# Patient Record
Sex: Female | Born: 1965 | Race: White | Hispanic: No | Marital: Married | Smoking: Never smoker
Health system: Southern US, Community
[De-identification: ages and names within clinical notes are randomized; demographics above are authoritative.]

## PROBLEM LIST (undated history)

## (undated) DIAGNOSIS — E039 Hypothyroidism, unspecified: Secondary | ICD-10-CM

## (undated) DIAGNOSIS — M549 Dorsalgia, unspecified: Secondary | ICD-10-CM

## (undated) DIAGNOSIS — G8929 Other chronic pain: Secondary | ICD-10-CM

## (undated) DIAGNOSIS — G43909 Migraine, unspecified, not intractable, without status migrainosus: Secondary | ICD-10-CM

## (undated) DIAGNOSIS — F419 Anxiety disorder, unspecified: Secondary | ICD-10-CM

## (undated) HISTORY — DX: Migraine, unspecified, not intractable, without status migrainosus: G43.909

## (undated) HISTORY — PX: ABDOMINAL SURGERY: SHX537

## (undated) HISTORY — DX: Dorsalgia, unspecified: M54.9

## (undated) HISTORY — DX: Hypothyroidism, unspecified: E03.9

## (undated) HISTORY — PX: SHOULDER SURGERY: SHX246

## (undated) HISTORY — DX: Other chronic pain: G89.29

## (undated) HISTORY — DX: Anxiety disorder, unspecified: F41.9

---

## 1998-06-11 ENCOUNTER — Ambulatory Visit (HOSPITAL_COMMUNITY): Admission: RE | Admit: 1998-06-11 | Discharge: 1998-06-11 | Payer: Self-pay | Admitting: Obstetrics and Gynecology

## 1998-06-11 ENCOUNTER — Encounter: Payer: Self-pay | Admitting: Obstetrics and Gynecology

## 1999-12-23 ENCOUNTER — Ambulatory Visit (HOSPITAL_COMMUNITY): Admission: RE | Admit: 1999-12-23 | Discharge: 1999-12-23 | Payer: Self-pay | Admitting: Obstetrics and Gynecology

## 1999-12-23 ENCOUNTER — Encounter: Payer: Self-pay | Admitting: Obstetrics and Gynecology

## 1999-12-29 ENCOUNTER — Encounter: Admission: RE | Admit: 1999-12-29 | Discharge: 2000-03-28 | Payer: Self-pay | Admitting: Obstetrics and Gynecology

## 2000-02-04 ENCOUNTER — Inpatient Hospital Stay (HOSPITAL_COMMUNITY): Admission: AD | Admit: 2000-02-04 | Discharge: 2000-02-04 | Payer: Self-pay | Admitting: Obstetrics and Gynecology

## 2000-02-19 ENCOUNTER — Encounter (HOSPITAL_COMMUNITY): Admission: AD | Admit: 2000-02-19 | Discharge: 2000-03-02 | Payer: Self-pay | Admitting: Obstetrics and Gynecology

## 2000-02-29 ENCOUNTER — Ambulatory Visit (HOSPITAL_COMMUNITY): Admission: RE | Admit: 2000-02-29 | Discharge: 2000-02-29 | Payer: Self-pay | Admitting: Obstetrics and Gynecology

## 2000-03-01 ENCOUNTER — Inpatient Hospital Stay (HOSPITAL_COMMUNITY): Admission: AD | Admit: 2000-03-01 | Discharge: 2000-03-05 | Payer: Self-pay | Admitting: *Deleted

## 2000-03-06 ENCOUNTER — Encounter: Admission: RE | Admit: 2000-03-06 | Discharge: 2000-06-04 | Payer: Self-pay | Admitting: Obstetrics and Gynecology

## 2000-03-07 ENCOUNTER — Inpatient Hospital Stay (HOSPITAL_COMMUNITY): Admission: AD | Admit: 2000-03-07 | Discharge: 2000-03-07 | Payer: Self-pay | Admitting: Obstetrics and Gynecology

## 2000-03-07 ENCOUNTER — Encounter: Payer: Self-pay | Admitting: Obstetrics and Gynecology

## 2000-11-04 ENCOUNTER — Inpatient Hospital Stay (HOSPITAL_COMMUNITY): Admission: EM | Admit: 2000-11-04 | Discharge: 2000-11-07 | Payer: Self-pay | Admitting: *Deleted

## 2000-11-09 ENCOUNTER — Other Ambulatory Visit (HOSPITAL_COMMUNITY): Admission: RE | Admit: 2000-11-09 | Discharge: 2000-11-17 | Payer: Self-pay | Admitting: Psychiatry

## 2001-05-18 ENCOUNTER — Other Ambulatory Visit: Admission: RE | Admit: 2001-05-18 | Discharge: 2001-05-18 | Payer: Self-pay | Admitting: Obstetrics and Gynecology

## 2001-10-19 ENCOUNTER — Encounter: Payer: Self-pay | Admitting: Otolaryngology

## 2001-10-19 ENCOUNTER — Encounter: Admission: RE | Admit: 2001-10-19 | Discharge: 2001-10-19 | Payer: Self-pay | Admitting: Otolaryngology

## 2001-10-20 ENCOUNTER — Encounter: Admission: RE | Admit: 2001-10-20 | Discharge: 2001-10-20 | Payer: Self-pay | Admitting: Infectious Diseases

## 2001-10-27 ENCOUNTER — Encounter: Admission: RE | Admit: 2001-10-27 | Discharge: 2001-10-27 | Payer: Self-pay | Admitting: Infectious Diseases

## 2002-06-08 ENCOUNTER — Other Ambulatory Visit: Admission: RE | Admit: 2002-06-08 | Discharge: 2002-06-08 | Payer: Self-pay | Admitting: Obstetrics and Gynecology

## 2003-01-28 ENCOUNTER — Encounter: Admission: RE | Admit: 2003-01-28 | Discharge: 2003-01-28 | Payer: Self-pay | Admitting: Otolaryngology

## 2003-03-30 HISTORY — PX: VAGINAL HYSTERECTOMY: SUR661

## 2003-09-13 ENCOUNTER — Other Ambulatory Visit: Admission: RE | Admit: 2003-09-13 | Discharge: 2003-09-13 | Payer: Self-pay | Admitting: Obstetrics and Gynecology

## 2003-11-19 ENCOUNTER — Encounter (INDEPENDENT_AMBULATORY_CARE_PROVIDER_SITE_OTHER): Payer: Self-pay | Admitting: *Deleted

## 2003-11-19 ENCOUNTER — Observation Stay (HOSPITAL_COMMUNITY): Admission: RE | Admit: 2003-11-19 | Discharge: 2003-11-20 | Payer: Self-pay | Admitting: Obstetrics and Gynecology

## 2003-12-03 ENCOUNTER — Inpatient Hospital Stay (HOSPITAL_COMMUNITY): Admission: AD | Admit: 2003-12-03 | Discharge: 2003-12-03 | Payer: Self-pay | Admitting: Obstetrics and Gynecology

## 2004-03-10 ENCOUNTER — Ambulatory Visit (HOSPITAL_BASED_OUTPATIENT_CLINIC_OR_DEPARTMENT_OTHER): Admission: RE | Admit: 2004-03-10 | Discharge: 2004-03-10 | Payer: Self-pay

## 2004-03-10 ENCOUNTER — Ambulatory Visit (HOSPITAL_COMMUNITY): Admission: RE | Admit: 2004-03-10 | Discharge: 2004-03-10 | Payer: Self-pay

## 2004-10-08 ENCOUNTER — Encounter: Admission: RE | Admit: 2004-10-08 | Discharge: 2004-10-08 | Payer: Self-pay | Admitting: Occupational Medicine
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
## 2006-06-17 ENCOUNTER — Encounter: Admission: RE | Admit: 2006-06-17 | Discharge: 2006-06-17 | Payer: Self-pay | Admitting: Neurology

## 2009-02-12 ENCOUNTER — Encounter: Admission: RE | Admit: 2009-02-12 | Discharge: 2009-02-12 | Payer: Self-pay | Admitting: Neurology

## 2010-03-15 ENCOUNTER — Emergency Department (HOSPITAL_COMMUNITY)
Admission: EM | Admit: 2010-03-15 | Discharge: 2010-03-16 | Payer: Self-pay | Source: Home / Self Care | Admitting: Emergency Medicine

## 2010-06-08 LAB — CBC
HCT: 40.9 % (ref 36.0–46.0)
Hemoglobin: 14.2 g/dL (ref 12.0–15.0)
MCV: 91.1 fL (ref 78.0–100.0)
Platelets: 299 10*3/uL (ref 150–400)
RBC: 4.49 MIL/uL (ref 3.87–5.11)
RDW: 12.4 % (ref 11.5–15.5)

## 2010-06-08 LAB — DIFFERENTIAL
Eosinophils Absolute: 0 10*3/uL (ref 0.0–0.7)
Eosinophils Relative: 0 % (ref 0–5)
Lymphs Abs: 2.2 10*3/uL (ref 0.7–4.0)

## 2010-06-08 LAB — POCT I-STAT, CHEM 8
Calcium, Ion: 1.12 mmol/L (ref 1.12–1.32)
Chloride: 104 mEq/L (ref 96–112)
HCT: 44 % (ref 36.0–46.0)
Potassium: 3.4 mEq/L — ABNORMAL LOW (ref 3.5–5.1)

## 2010-08-14 NOTE — Discharge Summary (Signed)
[facility]  Patient:    Mackenzie Huffman, Mackenzie Huffman                  MRN: [id number] Adm. Date:  [id number] Disc. Date: [id number] Attending:  Madelyn Flavors Dictator:   Danie Chandler, R.N.                           Discharge Summary  ADMITTING DIAGNOSES:          1. Intrauterine pregnancy at [redacted] weeks gestation.                               2. Gestational diabetes requiring insulin.                               3. Spontaneous onset of labor.                               4. Fetal macrosomia.                               5. Mature fetal pulmonary lung status.  DISCHARGE DIAGNOSES:          1. Intrauterine pregnancy at [redacted] weeks gestation.                               2. Gestational diabetes requiring insulin.                               3. Spontaneous onset of labor.                               4. Fetal macrosomia.                               5. Mature fetal pulmonary lung status.  PROCEDURE:                    On March 01, 2000 primary low transverse cesarean section.  REASON FOR ADMISSION:         Please see dictated H&P.  HOSPITAL COURSE:              The patient was taken to the operating room and underwent the above named procedure without complication.  This was productive of a viable female infant with Apgars of 6 at one minute and 8 at five minutes.  On postoperative day #1 the patient was without complaint.  Her vital signs were stable.  She had a good return of bowel function.  Her hemoglobin on this day was 10.3, hematocrit 29.7, and white blood cell count 16.2.  On postoperative day #2 the patient was tolerating a regular diet and ambulating well without difficulty.  Baby was in the NICU for breathing difficulties.  On postoperative day #3 the patient had better pain control. The baby was stable in NICU.  The patient was discharged home on postoperative day #4.  CONDITION ON DISCHARGE:       Good.  DIET:  Regular, as tolerated.  ACTIVITY:                     No heavy lifting, no driving, no vaginal entry.  FOLLOW-UP:                    She is to follow up in the office in one to two weeks for incision check.  She is to call for temperature greater than 100 degrees, persistent nausea or vomiting, heavy vaginal bleeding, and/or redness or drainage from the incision site.  DISCHARGE MEDICATIONS:        1. Prenatal vitamin one p.o. q.d.                               2. Motrin #30 600 mg one p.o. q.6h. p.r.n. pain.                               3. Percocet #40 one to two p.o. q.4h. p.r.n.                                  pain.                               4. Synthroid q.d. DD:  03/30/00 TD:  03/30/00 Job: [id number]

## 2010-08-14 NOTE — Discharge Summary (Signed)
[facility]  Patient:    Mackenzie Huffman, Mackenzie Huffman Visit Number: [id number] MRN: [id number]          Service Type: PSY Location: [facility] Attending Physician:  Benjaman Pott Dictated by:   Netta Cedars, M.D. Admit Date:  11/09/2000 Discharge Date: 11/17/2000                             Discharge Summary  INTRODUCTION:  The patient is a 45 year old married white female who was admitted voluntarily to [facility] after overdosing on Tylenol PM.  This woman has a history of depression but had not no formal treatment in the past.  The patient found herself in the middle of an argument between her husband and her parents.  She started taking a couple of Tylenol to calm herself down, was e-mailing her mother at the time, and expressed wishes that mother would take care of her husband and child and that she was sorry. Apparently mother got concerned receiving such an e-mail and the action that followed was that the patient was brought to the emergency room.  The patient was treated by Dr. Jeannetta Nap at [facility] for hypothyroidism with Synthroid 112 mcg daily and she was also receiving Paxil for symptoms of depression at 20 mg daily.  Initial physical examination was normal with, fortunately, acetaminophen level only 12.  The patient agreed to come to the hospital on a voluntary basis.  HOSPITAL COURSE:  After admission to the ward, the patient was placed on special observation.  She was able to contract for safety while on the unit. She was seen while on-call by Dr. Katrinka Blazing who described on August 10 as being still depressed with reserved, sad affect.  The patient was complaining of headache and tended to withdraw to her room.  On August 11, once again Dr. Katrinka Blazing noted that the patient was anxious but much less depressed.  She otherwise tolerated increase of Paxil from 20 mg to 30 mg well.  The same day, a family session took place with the  patients husband.  It seemed like the patients relationship between her and her husband and parents had been stabilized.  The patient felt that things were going to be better and the couple agreed to see a counselor on an outpatient basis.  At the time of this meeting, the patient did not express any dangerous ideations.  On August 12, I saw her for durance and at that time she was doing much better since admission.  Headache had improved.  She denied any dangerous thoughts.  She acknowledged need for psychotherapy.  The patient felt that she allowed herself to be a buffer between her and her husband and that she had to change her role otherwise things would get bad again.  I had a chance to talk to the patients husband who agreed to secure the patients medications and who felt comfortable with his wife doing better and thus could be safely discharged home.  It was felt that the patient could benefit from the outpatient program, especially group therapy, and she agreed to attend intensive outpatient treatment at our facility under the care of Dr. Ladona Ridgel.  Medical problems: As mentioned before, she had some headaches which seemed to be tension headaches and responded well when anxiety and depression got better.  The patients CBC and Chem 17 were normal.  Thyroid function tests showed increased TSH minimally to 5.84.  Urinalysis was  normal.  Vital signs throughout the hospitalization were normal with blood pressure 120/77, normal pulse, respiratory rate, and temperature.  DISCHARGE DIAGNOSES: Axis I:    Major depression, recurrent, moderate. Axis II:   Diagnosis deferred. Axis III:  1. Hypothyroidism.            2. Status post suicide attempt by overdose on Tylenol. Axis IV:   Problems with primary support group, moderate stressors. Axis V:    Global assessment of functioning upon admission 30, maximum for the            past year 75, upon discharge between 60-65.  DISCHARGE  MEDICATIONS: 1. Paxil 30 mg daily. 2. Synthroid 112 mcg daily.  DISCHARGE RECOMMENDATIONS:  The patient should call or come to the emergency room if any deterioration in her symptoms or side effects from medication.  If not sufficiently improved, she may benefit from change of antidepressant.  I also talked to the patient about the necessity to follow up with her family doctor for possible adjustment of thyroid medication.  At the time of discharge, the patient did not have any side effects from medications and in good condition, she was discharged home in the care of her husband. Dictated by:   Netta Cedars, M.D. Attending Physician:  Carolanne Grumbling D DD:  01/03/01 TD:  01/04/01 Job: [id number]

## 2010-08-14 NOTE — Op Note (Signed)
Mackenzie Huffman, Mackenzie Huffman                     ACCOUNT NO.:  [id number]   MEDICAL RECORD NO.:  [id number]                   PATIENT TYPE:  OBV   LOCATION:  9399                                 FACILITY:  [facility]   PHYSICIAN:  Guy Sandifer. Arleta Creek, M.D.           DATE OF BIRTH:  11/16/1965   DATE OF PROCEDURE:  11/19/2003  DATE OF DISCHARGE:                                 OPERATIVE REPORT   PREOPERATIVE DIAGNOSIS:  Dysmenorrhea.   POSTOPERATIVE DIAGNOSIS:  Dysmenorrhea.   OPERATION PERFORMED:  Laparoscopically assisted vaginal hysterectomy.   SURGEON:  Guy Sandifer. Henderson Cloud, M.D.   ASSISTANT:  Duke Salvia. Marcelle Overlie, M.D.   ANESTHESIA:  General with endotracheal intubation.   ESTIMATED BLOOD LOSS:  200 mL.   SPECIMENS:  Uterus.   INDICATIONS FOR PROCEDURE:  The patient is a 45 year old white female, G1,  P1 with a history of endometriosis and increasing dysmenorrhea.  Details are  dictated in the history and physical.  Laparoscopically assisted vaginal  hysterectomy and removal of one tube or ovary only if distinctly abnormal is  discussed with the patient preoperatively.  Potential risks and  complications have been discussed preoperatively including but not limited  to infection, bowel, bladder, ureteral damage, bleeding requiring  transfusion of blood products and possible transfusion reaction, HIV and  hepatitis acquisition, DVT, PE, pneumonia, laparotomy, fistula formation,  recurrent pelvic pain and postoperative dyspareunia.  All questions have  been answered and consent has been signed on the chart.   FINDINGS:  Upper abdomen is grossly normal.  Appendix was normal.  Uterus  was normal in size.  Tubes and ovaries are normal.  No active endometriosis  is noted anteriorly or posteriorly in the cul-de-sacs.   DESCRIPTION OF PROCEDURE:  The patient was taken to the operating room,  placed in dorsal supine position where general anesthesia was induced via  endotracheal intubation.   She was then placed in dorsal lithotomy position,  prepped abdominally and vaginally.  Bladder straight catheterized.  Hulka  tenaculum was placed.  The uterus was manipulated and she was draped in a  sterile fashion.  A small incision was made infraumbilically by grasping the  skin with Allis clamps and elevating the skin.  A disposable Veress needle  was then placed with a normal syringe and drop test.  Two liters of gas were  insufflated under low pressure.  There was good tympany in the right upper  quadrant with insufflation.  The Veress needle was removed.  10-11  disposable trocar sleeve was placed.  Placement was verified with a  laparoscope and no damage to surrounding structures was noted.  Pneumoperitoneum was induced.  A small suprapubic incision was made and a 5  mm disposable trocar sleeve was placed under direct visualization without  difficulty.  The above findings were noted.  Then using the Gyrus bipolar  cautery cutting instrument, the proximal ligaments were taken down  bilaterally to the level of the  vesicouterine peritoneum.  Good hemostasis  was maintained.  The vesicouterine peritoneum was incised in the midline,  hydrodissected and taken down cephalolaterally.  Instruments were removed.  Suprapubic trocar sleeve was removed and attention was turned to the vagina.  The posterior cul-de-sac was entered sharply.  The cervix was circumscribed  with the cautery.  The mucosa was advanced sharply and bluntly.  The  uterosacral ligaments were taken down bilaterally.  The ligaments were taken  down with the Gyrus bipolar instrument.  Progressive bites were then taken  of the bladder pillars, cardinal ligaments, uterine vessels bilaterally.  Fundus was delivered posteriorly, proximal ligaments were clamped and taken  down.  The pedicles were then ligated with free ties of 0 Monocryl.  All  suture will be 0 Monocryl unless otherwise designated.  The uterosacral  ligaments were  plicated to the vaginal cuff bilaterally.  They were then  plicated in the midline with a separate suture.  Cuff was closed with figure-  of-eights.  The patient has a small urethral meatus.  Therefore, a #12 Foley  catheter was placed and clear urine was noted.  Attention was returned to  the abdomen.  Copious irrigation and inspection under reduced  pneumoperitoneum multiple times revealed good hemostasis.  Excess fluid was  removed.  Suprapubic trocar sleeve was removed and the pneumoperitoneum was  completely reduced before the umbilical trocar sleeve was removed.  Subcutaneous tissues of the umbilical incision were closed with a 2-0 Vicryl  stitch.  It should be noted that the umbilical incision was injected with 5  mL of 0.5% plain Marcaine prior to the umbilical incision.  Both incisions  were then closed with surgical glue.  All counts were correct.  The patient  was awakened and taken to recovery room in stable condition.                                               Guy Sandifer Arleta Creek, M.D.    JET/MEDQ  D:  11/19/2003  T:  11/19/2003  Job:  [id number]

## 2010-08-14 NOTE — H&P (Signed)
[facility]  Patient:    Mackenzie Huffman, Mackenzie Huffman                  MRN: [id number] Adm. Date:  [id number] Attending:  Madelyn Flavors                         History and Physical  HISTORY OF PRESENT ILLNESS:   Ms. Mackenzie Huffman is a 45 year old female, gravida 1, admitted for a primary cesarean section, due to fetal macrosomia, gestational diabetes mellitus, and spontaneous onset of labor.  The patient on March 01, 2000,  underwent an amniocentesis which showed mature fetal pulmonary lung status.  The patient was originally set up for a cesarean section on Thursday, but experienced the spontaneous onset of labor today. She is now admitted for a cesarean delivery.  She at the time of the amniocentesis underwent an ultrasound which showed an estimated fetal weight of 4200 g.  The patient was counseled regarding the risks and benefits of a vaginal delivery, versus a cesarean section, and requested a cesarean delivery. She has been on insulin for gestational diabetes.  She has also been on prednisone for a PUPP rash.  PAST MEDICAL HISTORY:         1. History of depression, mild, on Paxil, stable                                  on Paxil.                               2. Hypothyroidism.  PAST SURGICAL HISTORY:        History of herniorrhaphy in 1998.  CURRENT MEDICATIONS:          1. Synthroid.                               2. Prednisone.                               3. Insulin.                               4. Paxil.                               5. Prenatal vitamins.  OBSTETRICAL LABORATORY DATA:  Maternal blood type O-positive, rubella immune, elevated glucola, consistent with gestational diabetes, group-B unknown.  ALLERGIES:                    TETRACYCLINE.  PHYSICAL EXAMINATION:  VITAL SIGNS:                  Stable, temperature 97.9 degrees, pulse 100, respirations 16, blood pressure 129/69, fetal heart tones 150s.  GENERAL:                       She is a well-developed, well-nourished female, gravid, in no acute distress.  HEENT:                        Within normal limits.  NECK:  Supple, without adenopathy or thyromegaly.  HEART:                        A regular rate and rhythm without murmur, gallop or rub.  LUNGS:                        Clear to auscultation.  BREASTS:                      Deferred.  ABDOMEN:                      Gravid and nontender.  EXTREMITIES:                  Grossly normal.  NEUROLOGIC:                   Grossly normal.  PELVIC:                       Normal external female genitalia noted.  The pelvic examination is deferred.  This was checked in the office by Dr. Guy Sandifer. Tomblin II, with unknown dilatation at this time.  ADMISSION DIAGNOSES:          1. Intrauterine pregnancy at term.                               2. Spontaneous onset of labor.                               3. Gestational diabetes, insulin-requiring.                               4. Mature fetal pulmonary lung status.                               5. Pruritic urticarial papillary plaques of                                  pregnancy rash.                               6. Depression, mild.                               7. Fetal macrosomia.  PLAN:                         Primary low transverse cesarean section.  DISCUSSION:                   The risks and benefits of surgery were explained to the patient.  The risks of bleeding, infection, risk of injury to the surrounding organs were reviewed.  The patient was allowed to ask questions, and wished to proceed. DD:  03/01/00 TD:  03/01/00 Job: [id number]

## 2010-08-14 NOTE — Op Note (Signed)
[facility]  Patient:    Mackenzie Huffman, Mackenzie Huffman                  MRN: [id number] Proc. Date: 03/01/00 Adm. Date:  [id number] Attending:  Madelyn Flavors                           Operative Report  PREOPERATIVE DIAGNOSIS:       1. Intrauterine pregnancy at 37 weeks.                               2. Gestational diabetes, insulin requiring.                               3. Spontaneous onset of labor.                               4. Fetal macrosomia.                               5. Mature fetal pulmonary lung status.  POSTOPERATIVE DIAGNOSIS:      1. Intrauterine pregnancy at 37 weeks.                               2. Gestational diabetes, insulin requiring.                               3. Spontaneous onset of labor.                               4. Fetal macrosomia.                               5. Mature fetal pulmonary lung status.  OPERATION:                    Primary low transverse cesarean section.  SURGEON:                      Willey Blade, M.D.  ANESTHESIA:                   Spinal.  ESTIMATED BLOOD LOSS:         800 cc  COMPLICATIONS:                None.  FINDINGS AT TIME OF SURGERY:  At 1650 through a low transverse uterine incision, a viable female infant was delivered from the vertex presentation. The baby was a female delivered promptly and easily at 4:15 p.m. Apgars were 6/8.  The babys weight was 8 pounds 13 ounces.  The pelvis was visualized at the time of surgery and noted to be normal. DESCRIPTION OF PROCEDURE:     The patient was taken to the operating room where a spinal anesthetic was administered.  The patient was placed on the operating table in the left lateral tilt position.  The abdomen was prepped and draped in the usual sterile fashion with Betadine and sterile  drapes.  A Foley catheter was inserted.  The abdomen was then entered through a Pfannenstiel incision and carried down sharply in the usual fashion.   The peritoneum was atraumatically entered. The vesicouterine peritoneum underlying the lower uterine segment was incised and a bladder flap was bluntly and sharply created over the lower uterine segment.  A bladder blade then placed behind the bladder.  The uterus was then entered through a low transverse incision and carried out laterally using the operators fingers.  The intraamniotic cavity was entered with abundant clear fluid noted.  The vertex was elevated into the incision and delivered promptly and easily at 1615.  The oral and nasopharynx was thoroughly bulb suctioned and the cord doubly clamped and cut, and the baby handed promptly to the pediatricians.   The baby was a female weighing 8 pounds 13 ounces.  Apgars were 6/8.  The placenta was then manually extracted intact with three-vessel cord without difficulty.  The anterior of the uterus was wiped clean thoroughly with a wet sponge.  The uterine incision was then closed in a two-layer fashion.  The first layer of running interlocking suture of #1 Vicryl suture.  A second imbricating suture was placed across the primary suture line with a running stitch of #1 Vicryl as well.  Good hemostasis was noted.  The pelvis was then thoroughly irrigated and noted to be hemostatic.  The pelvis was then visualized and noted to be normal.  Good hemostasis was once again noted.  The rectus muscle and anterior peritoneum was closed with a running stitch of #1 Vicryl.  The subfascial layers were hemostatic.  The fascia was then closed with a running stitch of 0 Panacryl.  The subfascial layers were irrigated and made hemostatic using the Bovie cautery.  The skin was reapproximated with staples and a sterile dressing applied.  Final sponge, needle, and instrument counts correct x 3.  There were no perioperative complications.  The patient did receive an antibiotic after cord clamp. DD:  03/01/00 TD:  03/01/00 Job: [id number]

## 2010-08-14 NOTE — Op Note (Signed)
Mackenzie Huffman, Mackenzie Huffman           ACCOUNT NO.:  [id number]   MEDICAL RECORD NO.:  [id number]          PATIENT TYPE:  AMB   LOCATION:  DSC                          FACILITY:  [facility]   PHYSICIAN:  Lorre Munroe., M.D.DATE OF BIRTH:  Nov 15, 1965   DATE OF PROCEDURE:  03/10/2004  DATE OF DISCHARGE:                                 OPERATIVE REPORT   PREOPERATIVE DIAGNOSES:  1.  Indirect left inguinal hernia.  2.  Sebaceous cyst of the back.   POSTOPERATIVE DIAGNOSES:  1.  Indirect left inguinal hernia.  2.  Sebaceous cyst of the back.   OPERATION PERFORMED:  1.  Repair of left inguinal hernia.  2.  Excision of sebaceous cyst of the back.   SURGEON:  Lebron Conners, M.D.   ANESTHESIA:  Local with monitored anesthesia care.   DESCRIPTION OF PROCEDURE:  After the patient was monitored and sedated and  had routine preparation and draping of the left inguinal region.  I  liberally infiltrated local anesthetic in the groin area and extended the  Pfannenstiel incision somewhat laterally using about 3 cm of the lateral  part of the incision.  I dissected down through the subcutaneous tissues  until I encountered the external oblique.  Feeling medially, I could find no  evidence of an incisional hernia.  I opened the external oblique in the  direction of its fibers into the superficial ring and noted expansion of the  round ligament indicating an indirect hernia.  The inguinal floor medial to  that was strong and there was no palpable evidence of a femoral hernia.  I  dissected the round ligament free of the pubic tubercle, cauterizing both  areas with the Bovie and I dissected the round ligament and hernia all the  way up to the deep ring and reduced the hernia.  I plugged the deep ring  with a generous patch of polypropylene mesh held in place with a 2-0 silk  suture.  I fashioned a patch of polypropylene mesh to fit the dissected area  of the inguinal floor and I sutured that on with  running 2-0 Prolene suture  from the pubic tubercle medially in the internal oblique fascia with a  basting stitch laterally with a running simple stitch in the shelving edge  of the inguinal ligament.  The repair appeared solid.  I closed the external  oblique and subcutaneous tissues with running layers of 3-0 Vicryl and  closed the skin with intracuticular 4-0 Vicryl and Steri-Strips.  We then  turned the patient on her side and I prepped the area of the palpable mass  of the left lower part of the back and then anesthetized it with local  anesthetic, made a short skin incision and dissected out a typical appearing  sebaceous cyst.  It came out intact and hemostasis was not a problem.  I  closed the skin with intracuticular 4-0 Vicryl and Steri-Strips.  The cyst  was about 1.5 cm in size.     Will  WB/MEDQ  D:  03/10/2004  T:  03/10/2004  Job:  [id number]

## 2010-08-14 NOTE — H&P (Signed)
Mackenzie Huffman, JEANCHARLES                     ACCOUNT NO.:  [id number]   MEDICAL RECORD NO.:  [id number]                   PATIENT TYPE:  OBV   LOCATION:  NA                                   FACILITY:  [facility]   PHYSICIAN:  Guy Sandifer. Arleta Creek, M.D.           DATE OF BIRTH:  02/18/1966   DATE OF ADMISSION:  11/19/2003  DATE OF DISCHARGE:                                HISTORY & PHYSICAL   CHIEF COMPLAINT:  Painful menses.   HISTORY OF PRESENT ILLNESS:  This patient is a 45 year old married white  female G1, P1, husband status post vasectomy with known endometriosis and  increasing dysmenorrhea.  This has been refractory to management with the  birth control pill.  Ultrasound and sonohistogram in March 2005 were  negative for intracavitary masses.  After discussion of the options, patient  is being admitted for laparoscopically assisted vaginal hysterectomy and  removal of a tube and ovary if abnormal.  Potential risks and complications  have been discussed preoperatively.   PAST MEDICAL HISTORY:  1. Hypothyroidism.  2. Depression.   PAST SURGICAL HISTORY:  1. Hernia repair in 1998.  2. Laparoscopy 2003.   MEDICATIONS:  Provigil, Zoloft, Synthroid.   ALLERGIES:  1. TETRACYCLINE leading to hives.  2. WELLBUTRIN leading to hives.   OBSTETRIC HISTORY:  Cesarean section x1.   FAMILY HISTORY:  Chronic hypertension in mother; asthma in mother; Crohn's  disease in brother; breast cancer maternal great aunt.   SOCIAL HISTORY:  Patient denies tobacco, alcohol, or drug abuse.   REVIEW OF SYSTEMS:  NEUROLOGY:  Denies headache.  PULMONARY:  Denies  shortness of breath.  CARDIOLOGY:  Denies chest pain.  GI:  Denies recent  changes in bowel habits.   PHYSICAL EXAMINATION:  HEIGHT:  5 feet 3-1/2 inches.  WEIGHT:  128 pounds.  BLOOD PRESSURE:  120/78.  HEENT:  Without thyromegaly.  LUNGS:  Clear to auscultation.  HEART:  Regular rate and rhythm.  BACK:  Without CVA tenderness.  BREASTS:  Without mass, retraction, discharge.  ABDOMEN:  Soft, nontender, without masses.  PELVIC:  Vulva, vagina, cervix without lesion.  Uterus normal size, mobile,  nontender.  Adnexa nontender without masses.  EXTREMITIES/NEUROLOGICAL:  Grossly within normal limits.   ASSESSMENT:  Dysmenorrhea.   PLAN:  Laparoscopically assisted vaginal hysterectomy.  Removal of one tube  or ovary if abnormal.                                               Guy Sandifer. Arleta Creek, M.D.    JET/MEDQ  D:  11/18/2003  T:  11/18/2003  Job:  [id number]

## 2010-08-14 NOTE — H&P (Signed)
[facility]  Patient:    Mackenzie Huffman, Mackenzie Huffman                  MRN: [id number] Adm. Date:  [id number] Attending:  Denny Peon Dictator:   Candi Leash. Theressa Stamps, N.P.                   Psychiatric Admission Assessment  DATE OF ADMISSION:  November 04, 2000  PATIENT IDENTIFICATION:  This is a 45 year old married white female voluntarily admitted on November 04, 2000, for overdosing on Tylenol PM.  HISTORY OF PRESENT ILLNESS:  The patient presents with a history of depression.  The patient had found herself in the middle of an argument between her husband and her parents.  The patient was feeling very overwhelmed and guilty, stating that was the worst fight they have had.  She started to take a couple Tylenol PM, was emailing her mother.  After this, the patient took some more Tylenol.  The patient was telling her mother to take care of her husband and child and that she was sorry.  Her mother got concerned.  The patient states she took the medicine "as a cry for help" and that she wanted to hopefully go to sleep and start the day fresh.  She is concerned that her parents see this as her being very "weak."  She is currently denying any suicidal or homicidal ideation, she denies any auditory or visual hallucinations or paranoia.  Her sleep has been fair.  She does have a new baby.  Her appetite has been satisfactory.  She is compliant with her medications.  PAST PSYCHIATRIC HISTORY:  She has a history of depression.  The patient has no outpatient treatment.  She uses the Internet, goes to Internet chat rooms to discuss depression.  This is her first admission, no prior suicide attempts.  SUBSTANCE ABUSE HISTORY:  She is a nonsmoker, denies any alcohol or substance abuse.  PAST MEDICAL HISTORY:  Primary care Shaelynn Dragos is Dr. Jeannetta Nap at [facility].  Medical problems are hypothyroidism.  MEDICATIONS: 1. Synthroid 112 mcg q.p.m. 2. Paxil 20 mg  q.p.m. 3. Birth control pills at h.s.  DRUG ALLERGIES:  TETRACYCLINE.  PHYSICAL EXAMINATION:  Performed at [facility] where the patient was charcoaled.  LABORATORY DATA:  Acetaminophen level was 12.  Urine drug screen was negative. Alcohol level was less than 10.  SOCIAL HISTORY:  She is a 45 year old married white female, married for 13 years.  She has a child 72 months old, lives with her spouse and child.  She is not working presently.  She has completed college.  She has no legal problems.  FAMILY HISTORY:  None that she is aware of.  MENTAL STATUS EXAMINATION:  She is an alert, young Caucasian female.  She is dressed casually.  She is cooperative with good eye contact.  Speech is normal and relevant.  Mood is depressed and sad.  Affect is sad and pleasant. Thought processes are coherent.  There is no evidence of psychosis, no auditory or visual hallucinations, no suicidal or homicidal ideations, no paranoia.  Cognitive functioning is intact.  Memory is good.  Judgment is poor.  Insight is fair.  Poor impulse control.  ADMISSION DIAGNOSES: Axis I:    Major depression, single episode. Axis II:   Deferred. Axis III:  Hypothyroidism. Axis IV:   Problems with primary support group. Axis V:    Current is 30, estimated this past year is 70 to  75.  INITIAL PLAN OF CARE:  Plan is a voluntary admission for depression and overdose on Tylenol.  Contract for safety, check every 15 minutes.  Will resume her routine medications.  Will obtain labs.  Will increase her antidepressant.  Our goal is to return the patient to her prior living arrangements, to consider therapy as the patient seems to have a lot of unresolved issues in her life, to follow up with mental health, to decrease depressive symptoms so the patient can be safe.  ESTIMATED LENGTH OF STAY:  Three to five days. DD:  11/04/00 TD:  11/04/00 Job: [id number]

## 2010-08-14 NOTE — Discharge Summary (Signed)
Mackenzie Huffman, SONS                     ACCOUNT NO.:  [id number]   MEDICAL RECORD NO.:  [id number]                   PATIENT TYPE:  OBV   LOCATION:  9309                                 [facility]ACILITY:  [facility]   PHYSICIAN:  Guy Sandifer. Arleta Creek, M.D.           DATE OF BIRTH:  June 09, 1965   DATE OF ADMISSION:  11/19/2003  DATE OF DISCHARGE:                                 DISCHARGE SUMMARY   ADMITTING DIAGNOSIS:  Dysmenorrhea.   DISCHARGE DIAGNOSIS:  Dysmenorrhea.   PROCEDURE:  On November 19, 2003, laparoscopic-assisted vaginal hysterectomy.   REASON FOR ADMISSION:  This patient is a 45 year old white female G1 P1 with  known endometriosis and increasing dysmenorrhea.  Details are dictated in  the History and Physical.  She is admitted for surgical management.   HOSPITAL COURSE:  The patient is taken to the operating room, undergoes the  above procedure.  On the evening of surgery she has good pain relief, is  tolerating a regular diet.  Vital signs are stable, she is afebrile with a  good urine output.  On the day of discharge pathology is pending.  She  remains afebrile with stable vital signs.  Passing flatus, tolerating a  regular diet, and ambulating well.  White count 11.4 and hemoglobin 9.4.   CONDITION ON DISCHARGE:  Good.   DIET:  Regular as tolerated.   ACTIVITY:  No lifting, no operation of automobiles, no vaginal entry.  She  is to call the office for problems including but not limited to temperature  of 101 degrees, persistent nausea/vomiting, increasing pain, or heavy  vaginal bleeding.   MEDICATIONS:  1. Ibuprofen 600 mg q.6h. p.r.n.  2. Percocet 5/325 mg #30 one to two p.o. q.6h. p.r.n.  3. Multivitamin daily.  4. Colace one to two per day as needed.   FOLLOW-UP:  In the office in 2 weeks.                                               Guy Sandifer Arleta Creek, M.D.    JET/MEDQ  D:  11/20/2003  T:  11/20/2003  Job:  [id number]

## 2012-03-29 HISTORY — PX: OTHER SURGICAL HISTORY: SHX169

## 2014-05-27 ENCOUNTER — Encounter: Payer: Self-pay | Admitting: *Deleted

## 2014-07-31 ENCOUNTER — Other Ambulatory Visit: Payer: Self-pay

## 2014-07-31 DIAGNOSIS — R1909 Other intra-abdominal and pelvic swelling, mass and lump: Secondary | ICD-10-CM

## 2014-07-31 NOTE — Addendum Note (Signed)
Addended by: Adolph PollackOSENBOWER, Elyza Whitt J on: 07/31/2014 11:05 AM   Modules accepted: Orders

## 2014-08-02 ENCOUNTER — Other Ambulatory Visit: Payer: Self-pay | Admitting: General Surgery

## 2014-08-02 DIAGNOSIS — R1909 Other intra-abdominal and pelvic swelling, mass and lump: Secondary | ICD-10-CM

## 2014-08-05 ENCOUNTER — Other Ambulatory Visit: Payer: Self-pay

## 2014-08-08 ENCOUNTER — Other Ambulatory Visit: Payer: Self-pay | Admitting: Radiology

## 2014-08-12 ENCOUNTER — Ambulatory Visit
Admission: RE | Admit: 2014-08-12 | Discharge: 2014-08-12 | Disposition: A | Discharge status: Home / Self Care | Payer: Medicaid Other | Source: Ambulatory Visit | Attending: General Surgery | Admitting: General Surgery

## 2014-08-12 DIAGNOSIS — R1909 Other intra-abdominal and pelvic swelling, mass and lump: Secondary | ICD-10-CM

## 2014-08-12 MED ORDER — IOPAMIDOL (ISOVUE-300) INJECTION 61%
100.0000 mL | Freq: Once | INTRAVENOUS | Status: AC | PRN
  Administered 2014-08-12: 100 mL via INTRAVENOUS

## 2014-08-20 ENCOUNTER — Encounter: Payer: Self-pay | Admitting: General Surgery

## 2014-09-27 ENCOUNTER — Encounter (HOSPITAL_BASED_OUTPATIENT_CLINIC_OR_DEPARTMENT_OTHER): Payer: Self-pay | Admitting: *Deleted

## 2014-10-03 ENCOUNTER — Ambulatory Visit (HOSPITAL_BASED_OUTPATIENT_CLINIC_OR_DEPARTMENT_OTHER): Payer: Medicaid Other | Admitting: Anesthesiology

## 2014-10-03 ENCOUNTER — Ambulatory Visit (HOSPITAL_BASED_OUTPATIENT_CLINIC_OR_DEPARTMENT_OTHER)
Admission: RE | Admit: 2014-10-03 | Discharge: 2014-10-03 | Disposition: A | Discharge status: Home / Self Care | Payer: Medicaid Other | Source: Ambulatory Visit | Attending: General Surgery | Admitting: General Surgery

## 2014-10-03 ENCOUNTER — Encounter (HOSPITAL_BASED_OUTPATIENT_CLINIC_OR_DEPARTMENT_OTHER): Payer: Self-pay

## 2014-10-03 ENCOUNTER — Encounter (HOSPITAL_BASED_OUTPATIENT_CLINIC_OR_DEPARTMENT_OTHER)
Admission: RE | Disposition: A | Discharge status: Home / Self Care | Payer: Self-pay | Source: Ambulatory Visit | Attending: General Surgery

## 2014-10-03 DIAGNOSIS — Z880 Allergy status to penicillin: Secondary | ICD-10-CM | POA: Diagnosis not present

## 2014-10-03 DIAGNOSIS — R1909 Other intra-abdominal and pelvic swelling, mass and lump: Secondary | ICD-10-CM | POA: Insufficient documentation

## 2014-10-03 DIAGNOSIS — Z79899 Other long term (current) drug therapy: Secondary | ICD-10-CM | POA: Insufficient documentation

## 2014-10-03 DIAGNOSIS — Z90710 Acquired absence of both cervix and uterus: Secondary | ICD-10-CM | POA: Diagnosis not present

## 2014-10-03 DIAGNOSIS — F419 Anxiety disorder, unspecified: Secondary | ICD-10-CM | POA: Insufficient documentation

## 2014-10-03 DIAGNOSIS — M549 Dorsalgia, unspecified: Secondary | ICD-10-CM | POA: Diagnosis not present

## 2014-10-03 DIAGNOSIS — G43909 Migraine, unspecified, not intractable, without status migrainosus: Secondary | ICD-10-CM | POA: Diagnosis not present

## 2014-10-03 DIAGNOSIS — Z8249 Family history of ischemic heart disease and other diseases of the circulatory system: Secondary | ICD-10-CM | POA: Insufficient documentation

## 2014-10-03 DIAGNOSIS — R590 Localized enlarged lymph nodes: Secondary | ICD-10-CM | POA: Diagnosis not present

## 2014-10-03 DIAGNOSIS — Z888 Allergy status to other drugs, medicaments and biological substances status: Secondary | ICD-10-CM | POA: Diagnosis not present

## 2014-10-03 DIAGNOSIS — Z8349 Family history of other endocrine, nutritional and metabolic diseases: Secondary | ICD-10-CM | POA: Insufficient documentation

## 2014-10-03 HISTORY — PX: GROIN DISSECTION: SHX5250

## 2014-10-03 LAB — POCT I-STAT, CHEM 8
BUN: 13 mg/dL (ref 6–20)
CREATININE: 0.5 mg/dL (ref 0.44–1.00)
Calcium, Ion: 1.1 mmol/L — ABNORMAL LOW (ref 1.12–1.23)
Chloride: 108 mmol/L (ref 101–111)
GLUCOSE: 93 mg/dL (ref 65–99)
HCT: 43 % (ref 36.0–46.0)
Hemoglobin: 14.6 g/dL (ref 12.0–15.0)
POTASSIUM: 3.8 mmol/L (ref 3.5–5.1)
Sodium: 138 mmol/L (ref 135–145)
TCO2: 19 mmol/L (ref 0–100)

## 2014-10-03 SURGERY — EXPLORATION, INGUINAL REGION
Anesthesia: General | Site: Groin | Laterality: Right

## 2014-10-03 MED ORDER — MIDAZOLAM HCL 2 MG/2ML IJ SOLN
1.0000 mg | INTRAMUSCULAR | Status: DC | PRN
  Administered 2014-10-03: 2 mg via INTRAVENOUS

## 2014-10-03 MED ORDER — ONDANSETRON HCL 4 MG/2ML IJ SOLN
INTRAMUSCULAR | Status: DC | PRN
  Administered 2014-10-03: 4 mg via INTRAVENOUS

## 2014-10-03 MED ORDER — LIDOCAINE HCL (CARDIAC) 20 MG/ML IV SOLN
INTRAVENOUS | Status: DC | PRN
  Administered 2014-10-03: 75 mg via INTRAVENOUS

## 2014-10-03 MED ORDER — GLYCOPYRROLATE 0.2 MG/ML IJ SOLN: 0.2000 mg | Freq: Once | INTRAMUSCULAR | Status: DC | PRN

## 2014-10-03 MED ORDER — PROPOFOL 500 MG/50ML IV EMUL
INTRAVENOUS | Status: AC
  Filled 2014-10-03: qty 100

## 2014-10-03 MED ORDER — SUFENTANIL CITRATE 50 MCG/ML IV SOLN
INTRAVENOUS | Status: DC | PRN
  Administered 2014-10-03: 10 ug via INTRAVENOUS

## 2014-10-03 MED ORDER — LACTATED RINGERS IV SOLN
INTRAVENOUS | Status: DC
  Administered 2014-10-03: 10:00:00 via INTRAVENOUS

## 2014-10-03 MED ORDER — OXYCODONE HCL 5 MG PO TABS
5.0000 mg | ORAL_TABLET | Freq: Once | ORAL | Status: AC | PRN
  Administered 2014-10-03: 5 mg via ORAL

## 2014-10-03 MED ORDER — MEPERIDINE HCL 25 MG/ML IJ SOLN: 6.2500 mg | INTRAMUSCULAR | Status: DC | PRN

## 2014-10-03 MED ORDER — OXYCODONE HCL 5 MG PO TABS: 5.0000 mg | ORAL_TABLET | ORAL | Status: DC | PRN

## 2014-10-03 MED ORDER — VANCOMYCIN HCL IN DEXTROSE 1-5 GM/200ML-% IV SOLN
INTRAVENOUS | Status: AC
  Filled 2014-10-03: qty 200

## 2014-10-03 MED ORDER — HYDROMORPHONE HCL 1 MG/ML IJ SOLN
0.2500 mg | INTRAMUSCULAR | Status: DC | PRN
  Administered 2014-10-03: 0.25 mg via INTRAVENOUS

## 2014-10-03 MED ORDER — SCOPOLAMINE 1 MG/3DAYS TD PT72: 1.0000 | MEDICATED_PATCH | Freq: Once | TRANSDERMAL | Status: DC | PRN

## 2014-10-03 MED ORDER — DEXAMETHASONE SODIUM PHOSPHATE 4 MG/ML IJ SOLN
INTRAMUSCULAR | Status: DC | PRN
  Administered 2014-10-03: 10 mg via INTRAVENOUS

## 2014-10-03 MED ORDER — LIDOCAINE-EPINEPHRINE (PF) 1 %-1:200000 IJ SOLN
INTRAMUSCULAR | Status: AC
  Filled 2014-10-03: qty 10

## 2014-10-03 MED ORDER — SUFENTANIL CITRATE 50 MCG/ML IV SOLN
INTRAVENOUS | Status: AC
  Filled 2014-10-03: qty 1

## 2014-10-03 MED ORDER — SUCCINYLCHOLINE CHLORIDE 20 MG/ML IJ SOLN
INTRAMUSCULAR | Status: AC
  Filled 2014-10-03: qty 1

## 2014-10-03 MED ORDER — FENTANYL CITRATE (PF) 100 MCG/2ML IJ SOLN: 50.0000 ug | INTRAMUSCULAR | Status: DC | PRN

## 2014-10-03 MED ORDER — VANCOMYCIN HCL IN DEXTROSE 1-5 GM/200ML-% IV SOLN
1000.0000 mg | INTRAVENOUS | Status: AC
  Administered 2014-10-03: 1000 mg via INTRAVENOUS

## 2014-10-03 MED ORDER — OXYCODONE HCL 5 MG/5ML PO SOLN: 5.0000 mg | Freq: Once | ORAL | Status: AC | PRN

## 2014-10-03 MED ORDER — MIDAZOLAM HCL 2 MG/2ML IJ SOLN
INTRAMUSCULAR | Status: AC
  Filled 2014-10-03: qty 2

## 2014-10-03 MED ORDER — BUPIVACAINE HCL (PF) 0.5 % IJ SOLN
INTRAMUSCULAR | Status: DC | PRN
  Administered 2014-10-03: 9 mL

## 2014-10-03 MED ORDER — OXYCODONE HCL 5 MG PO TABS
ORAL_TABLET | ORAL | Status: AC
  Filled 2014-10-03: qty 1

## 2014-10-03 MED ORDER — PROPOFOL 10 MG/ML IV BOLUS
INTRAVENOUS | Status: DC | PRN
  Administered 2014-10-03: 200 mg via INTRAVENOUS

## 2014-10-03 MED ORDER — HYDROMORPHONE HCL 1 MG/ML IJ SOLN
INTRAMUSCULAR | Status: AC
  Filled 2014-10-03: qty 1

## 2014-10-03 MED ORDER — BUPIVACAINE HCL (PF) 0.5 % IJ SOLN
INTRAMUSCULAR | Status: AC
  Filled 2014-10-03: qty 30

## 2014-10-03 SURGICAL SUPPLY — 56 items
APL SKNCLS STERI-STRIP NONHPOA (GAUZE/BANDAGES/DRESSINGS) ×1
BENZOIN TINCTURE PRP APPL 2/3 (GAUZE/BANDAGES/DRESSINGS) ×3 IMPLANT
BLADE CLIPPER SURG (BLADE) IMPLANT
BLADE SURG 10 STRL SS (BLADE) ×1 IMPLANT
BLADE SURG 15 STRL LF DISP TIS (BLADE) ×1 IMPLANT
BLADE SURG 15 STRL SS (BLADE) ×3
CANISTER SUCT 1200ML W/VALVE (MISCELLANEOUS) IMPLANT
CHLORAPREP W/TINT 26ML (MISCELLANEOUS) ×3 IMPLANT
CLEANER CAUTERY TIP 5X5 PAD (MISCELLANEOUS) ×1 IMPLANT
CLOSURE WOUND 1/2 X4 (GAUZE/BANDAGES/DRESSINGS) ×1
COVER BACK TABLE 60X90IN (DRAPES) ×3 IMPLANT
COVER MAYO STAND STRL (DRAPES) ×3 IMPLANT
DECANTER SPIKE VIAL GLASS SM (MISCELLANEOUS) ×1 IMPLANT
DRAIN PENROSE 1/2X12 LTX STRL (WOUND CARE) ×1 IMPLANT
DRAPE INCISE IOBAN 66X45 STRL (DRAPES) ×1 IMPLANT
DRAPE LAPAROTOMY T 102X78X121 (DRAPES) ×3 IMPLANT
DRAPE UTILITY XL STRL (DRAPES) ×5 IMPLANT
DRSG TEGADERM 2-3/8X2-3/4 SM (GAUZE/BANDAGES/DRESSINGS) IMPLANT
DRSG TEGADERM 4X4.75 (GAUZE/BANDAGES/DRESSINGS) ×3 IMPLANT
ELECT REM PT RETURN 9FT ADLT (ELECTROSURGICAL) ×3
ELECTRODE REM PT RTRN 9FT ADLT (ELECTROSURGICAL) ×1 IMPLANT
GAUZE SPONGE 4X4 16PLY XRAY LF (GAUZE/BANDAGES/DRESSINGS) ×2 IMPLANT
GLOVE BIO SURGEON STRL SZ 6.5 (GLOVE) ×1 IMPLANT
GLOVE BIO SURGEONS STRL SZ 6.5 (GLOVE) ×1
GLOVE BIOGEL PI IND STRL 7.0 (GLOVE) IMPLANT
GLOVE BIOGEL PI IND STRL 8.5 (GLOVE) ×1 IMPLANT
GLOVE BIOGEL PI INDICATOR 7.0 (GLOVE) ×2
GLOVE BIOGEL PI INDICATOR 8.5 (GLOVE) ×2
GLOVE ECLIPSE 8.0 STRL XLNG CF (GLOVE) ×3 IMPLANT
GLOVE EXAM NITRILE EXT CUFF MD (GLOVE) ×2 IMPLANT
GOWN STRL REUS W/ TWL LRG LVL3 (GOWN DISPOSABLE) ×1 IMPLANT
GOWN STRL REUS W/TWL LRG LVL3 (GOWN DISPOSABLE) ×9
NDL HYPO 25X1 1.5 SAFETY (NEEDLE) ×1 IMPLANT
NEEDLE HYPO 25X1 1.5 SAFETY (NEEDLE) ×3 IMPLANT
NS IRRIG 1000ML POUR BTL (IV SOLUTION) IMPLANT
PACK BASIN DAY SURGERY FS (CUSTOM PROCEDURE TRAY) ×3 IMPLANT
PAD CLEANER CAUTERY TIP 5X5 (MISCELLANEOUS) ×2
PENCIL BUTTON HOLSTER BLD 10FT (ELECTRODE) ×3 IMPLANT
SLEEVE SCD COMPRESS KNEE MED (MISCELLANEOUS) ×2 IMPLANT
SPONGE INTESTINAL PEANUT (DISPOSABLE) IMPLANT
STRIP CLOSURE SKIN 1/2X4 (GAUZE/BANDAGES/DRESSINGS) ×2 IMPLANT
SUT MNCRL AB 3-0 PS2 18 (SUTURE) ×2 IMPLANT
SUT MON AB 4-0 PC3 18 (SUTURE) ×1 IMPLANT
SUT PROLENE 2 0 CT2 30 (SUTURE) ×2 IMPLANT
SUT VIC AB 2-0 SH 18 (SUTURE) ×1 IMPLANT
SUT VIC AB 3-0 SH 27 (SUTURE) ×3
SUT VIC AB 3-0 SH 27X BRD (SUTURE) ×1 IMPLANT
SUT VICRYL 3-0 CR8 SH (SUTURE) ×2 IMPLANT
SUT VICRYL AB 2 0 TIE (SUTURE) IMPLANT
SUT VICRYL AB 2 0 TIES (SUTURE)
SYR CONTROL 10ML LL (SYRINGE) ×3 IMPLANT
TOWEL OR 17X24 6PK STRL BLUE (TOWEL DISPOSABLE) ×4 IMPLANT
TOWEL OR NON WOVEN STRL DISP B (DISPOSABLE) ×1 IMPLANT
TUBE CONNECTING 20'X1/4 (TUBING)
TUBE CONNECTING 20X1/4 (TUBING) IMPLANT
YANKAUER SUCT BULB TIP NO VENT (SUCTIONS) IMPLANT

## 2014-10-03 NOTE — Anesthesia Procedure Notes (Signed)
Procedure Name: LMA Insertion Date/Time: 10/03/2014 10:11 AM Performed by: Zenia ResidesPAYNE, Christophor Eick D Pre-anesthesia Checklist: Patient identified, Emergency Drugs available, Suction available and Patient being monitored Patient Re-evaluated:Patient Re-evaluated prior to inductionOxygen Delivery Method: Circle System Utilized Preoxygenation: Pre-oxygenation with 100% oxygen Intubation Type: IV induction Ventilation: Mask ventilation without difficulty LMA: LMA inserted LMA Size: 4.0 Number of attempts: 1 Airway Equipment and Method: Bite block Placement Confirmation: positive ETCO2 Tube secured with: Tape Dental Injury: Teeth and Oropharynx as per pre-operative assessment

## 2014-10-03 NOTE — Anesthesia Preprocedure Evaluation (Signed)

## 2014-10-03 NOTE — Interval H&P Note (Signed)
History and Physical Interval Note:  10/03/2014 10:01 AM  Mackenzie Huffman  has presented today for surgery, with the diagnosis of RIGHT GROIN MASS  The various methods of treatment have been discussed with the patient and family. After consideration of risks, benefits and other options for treatment, the patient has consented to  Procedure(s): GROIN EXPLORATION AND REMOVAL OF GROIN MASS (Right) as a surgical intervention .  The patient's history has been reviewed, patient examined, no change in status, stable for surgery.  I have reviewed the patient's chart and labs.  Questions were answered to the patient's satisfaction.     [name] JShela Commons

## 2014-10-03 NOTE — H&P (Signed)
Mackenzie Huffman  Location: [facility] Patient #: [id number] DOB: 1965/09/03 Married / Language: English / Race: White Female  History of Present Illness  Patient words: Reevaluate right groin pain and mass.  The patient is a 49 year old female   Note:She is here for follow-up visit after her pelvic CT scan. This did not show any evidence of a right groin hernia. In fact, no soft tissue mass was seen. She reports she still has the mass in the right groin area which is significantly painful at times. As for the 3.4 cm right adnexal lesion, she is going to get a referral to a gynecologist.   Allergies  No Known Drug Allergies05/06/2014  Prior to Admission medications   Medication Sig Start Date End Date Taking? Authorizing Provider  Ascorbic Acid (VITAMIN C) 1000 MG tablet Take 1,000 mg by mouth daily.   Yes Historical Provider, MD  atenolol (TENORMIN) 100 MG tablet Take 100 mg by mouth daily.   Yes Historical Provider, MD  cyclobenzaprine (FLEXERIL) 10 MG tablet Take 10 mg by mouth 3 (three) times daily as needed for muscle spasms.   Yes Historical Provider, MD  levothyroxine (SYNTHROID, LEVOTHROID) 75 MCG tablet Take 75 mcg by mouth daily before breakfast.   Yes Historical Provider, MD  oxyCODONE-acetaminophen (PERCOCET) 10-325 MG per tablet Take 1 tablet by mouth every 4 (four) hours as needed for pain.   Yes Historical Provider, MD  SUMAtriptan (IMITREX) 50 MG tablet Take 50 mg by mouth once. May repeat in 2 hours if headache persists or recurs.   Yes Historical Provider, MD      Physical Exam The physical exam findings are as follows: :Right groin-there is a 2-2.5 cm palpable soft tissue mass that is tender to palpation. It does not move with the Valsalva maneuver.    Assessment & Plan  RIGHT GROIN MASS (789.39  R19.09) Impression: Not seen on CT scan but persists on physical exam. Remains symptomatic.  Plan: Right groin exploration and removal of mass. If  this is a hernia we will repair that with mesh at the time. She and I have discussed this at length. I did tell her that there was no guarantee this will get rid of all of her pain. The procedure and risks were discussed with her. The risks include but are not limited to bleeding, infection, wound healing problem, anesthesia, nerve injury, need for further surgery, recurrence if we had do hernia repair with mesh. She seems to understand this and would like to proceed.  Avel Peaceodd Ellenie Salome, M.D.

## 2014-10-03 NOTE — Discharge Instructions (Addendum)
[facility] _______[facility]  GROIN SURGERY: POST OP INSTRUCTIONS  Always review your discharge instruction sheet given to you by the facility where your surgery was performed. IF YOU HAVE DISABILITY OR FAMILY LEAVE FORMS, YOU MUST BRING THEM TO THE OFFICE FOR PROCESSING.   DO NOT GIVE THEM TO YOUR DOCTOR.  1. A  prescription for pain medication may be given to you upon discharge.  Take your pain medication as prescribed, if needed.  If narcotic pain medicine is not needed, then you may take acetaminophen (Tylenol) or ibuprofen (Advil) as needed. 2. Take your usually prescribed medications unless otherwise directed. 3. If you need a refill on your pain medication, please contact your pharmacy.  They will contact our office to request authorization. Prescriptions will not be filled after 5 pm or on week-ends. 4. You should follow a light diet the first 24 hours after arrival home, such as soup and crackers, etc.  Be sure to include lots of fluids daily.  Resume your normal diet the day after surgery. 5. Most patients will experience some swelling and bruising in the groin.  Ice packs and reclining will help.  Swelling and bruising can take several days to resolve.  6. It is common to experience some constipation if taking pain medication after surgery.  Increasing fluid intake and taking a stool softener (such as Colace) will usually help or prevent this problem from occurring.  A mild laxative (Milk of Magnesia or Miralax) should be taken according to package directions if there are no bowel movements after 48 hours. 7. Unless discharge instructions indicate otherwise, you may remove your bandages 72 hours after surgery.   You may shower the day after surgery..  You may have steri-strips (small skin tapes) in place directly over the incision.  These strips should be left on the skin for 15 days.  If your surgeon used skin glue on the incision, you may shower in 24 hours.  The glue will flake  off over the next 2-3 weeks.  Any sutures or staples will be removed at the office during your follow-up visit. 8. ACTIVITIES:  You may resume regular (light) daily activities beginning the next day--such as daily self-care, walking, climbing stairs--gradually increasing activities as tolerated.  You may have sexual intercourse when it is comfortable.  Refrain from any heavy lifting or straining for 1-2 weeks until your are pain-free. a. You may drive when you are no longer taking prescription pain medication, you can comfortably wear a seatbelt, and you can safely maneuver your car and apply brakes. b. RETURN TO WORK:  _ Desk work/light work in 1-2 weeks, full duty when pain-free._________________________________________________________ 9. You should see your doctor in the office for a follow-up appointment approximately 2-3 weeks after your surgery.  Make sure that you call for this appointment within a day or two after you arrive home to insure a convenient appointment time. 10. OTHER INSTRUCTIONS:  __________________________________________________________________________________________________________________________________________________________________________________________  WHEN TO CALL YOUR DOCTOR: 1. Fever over 101.0 2. Inability to urinate 3. Nausea and/or vomiting 4. Extreme swelling or bruising 5. Continued bleeding from incision. 6. Increased pain, redness, or drainage from the incision  The clinic staff is available to answer your questions during regular business hours.  Please dont hesitate to call and ask to speak to one of the nurses for clinical concerns.  If you have a medical emergency, go to the nearest emergency room or call 911.  A surgeon from [facility] is always on call  at the hospital   [address], [address], [place], [place]  [address] ?  [address] [address], [place], [place]   [id number] [phone] ? [phone] ? FAX [phone] Web site:  [email or web address]  Post Anesthesia Home Care Instructions  Activity: Get plenty of rest for the remainder of the day. A responsible adult should stay with you for 24 hours following the procedure.  For the next 24 hours, DO NOT: -Drive a car -Advertising copywriterperate machinery -Drink alcoholic beverages -Take any medication unless instructed by your physician -Make any legal decisions or sign important papers.  Meals: Start with liquid foods such as gelatin or soup. Progress to regular foods as tolerated. Avoid greasy, spicy, heavy foods. If nausea and/or vomiting occur, drink only clear liquids until the nausea and/or vomiting subsides. Call your physician if vomiting continues.  Special Instructions/Symptoms: Your throat may feel dry or sore from the anesthesia or the breathing tube placed in your throat during surgery. If this causes discomfort, gargle with warm salt water. The discomfort should disappear within 24 hours.  If you had a scopolamine patch placed behind your ear for the management of post- operative nausea and/or vomiting:  1. The medication in the patch is effective for 72 hours, after which it should be removed.  Wrap patch in a tissue and discard in the trash. Wash hands thoroughly with soap and water. 2. You may remove the patch earlier than 72 hours if you experience unpleasant side effects which may include dry mouth, dizziness or visual disturbances. 3. Avoid touching the patch. Wash your hands with soap and water after contact with the patch.

## 2014-10-03 NOTE — Op Note (Addendum)
Operative Note  Mackenzie Huffman female 49 y.o. 10/03/2014  PREOPERATIVE DX:  Painful soft tissue mass right groin  POSTOPERATIVE DX:  Same  PROCEDURE:   Right groin exploration and removal of 4 cm soft tissue mass         Surgeon: Adolph PollackOSENBOWER,Alexyss Balzarini J   Assistants: none  Anesthesia: General LMA anesthesia  Indications:   This is a 49 year old female has developed a painful right groin soft tissue mass just lateral and inferior to her cesarean section scar.  The mass is evident on clinical exam but not on pelvic CT scan. She now presents for right groin exploration.  We discussed the fact that this may not resolve her pain.    Procedure Detail:  She was seen in the holding area. The mass was palpated and was tender. My initials were placed in the right groin. She was brought to the operating room placed supine on the operating table and a general anesthetic was given. Right groin area were sterilely prepped and draped.  Half percent Marcaine was infiltrated in the subdermal area. A right groin incision was made, below the groin crease, through the skin and subcutaneous tissues. The mass was palpable was very firm. Using electrocautery I began dissecting the mass free from the underlying fascial tissues. There was a lymph node identified that was part of the mass. The mass appeared mostly lipomatous in nature other than the lymph node that was identified. The mass was subsequently removed and then sent to pathology. Bleeding was controlled with electrocautery. Some small bleeding vessels were ligated as well.  Marcaine was then injected into the depth of the wound for local anesthetic effect.  The wound was inspected and hemostasis was adequate. Subcutaneous tissues and closed with interrupted 3-0 Vicryl sutures. The skin was closed with a running 3-0 Monocryl subcuticular stitch. Steri-Strips and a sterile dressing were applied.  She tolerated the procedure well without any apparent  complications and was taken to recovery room in satisfactory condition.  Estimated Blood Loss:  less than 100 mL                Specimens: 4 cm right groin soft tissue mass        Complications:  * No complications entered in OR log *         Disposition: PACU - hemodynamically stable.         Condition: stable

## 2014-10-03 NOTE — Anesthesia Postprocedure Evaluation (Signed)
  Anesthesia Post-op Note  Patient: Mackenzie Huffman  Procedure(s) Performed: Procedure(s): GROIN EXPLORATION AND REMOVAL OF GROIN MASS (Right)  Patient Location: PACU  Anesthesia Type: General   Level of Consciousness: awake, alert  and oriented  Airway and Oxygen Therapy: Patient Spontanous Breathing  Post-op Pain: mild  Post-op Assessment: Post-op Vital signs reviewed  Post-op Vital Signs: Reviewed  Last Vitals:  Filed Vitals:   10/03/14 1200  BP: 115/71  Pulse: 72  Temp: 37 C  Resp: 14    Complications: No apparent anesthesia complications

## 2014-10-03 NOTE — Transfer of Care (Signed)
Immediate Anesthesia Transfer of Care Note  Patient: Mackenzie Huffman  Procedure(s) Performed: Procedure(s): GROIN EXPLORATION AND REMOVAL OF GROIN MASS (Right)  Patient Location: PACU  Anesthesia Type:General  Level of Consciousness: sedated  Airway & Oxygen Therapy: Patient Spontanous Breathing and Patient connected to face mask oxygen  Post-op Assessment: Report given to RN and Post -op Vital signs reviewed and stable  Post vital signs: Reviewed and stable  Last Vitals:  Filed Vitals:   10/03/14 0928  BP: 127/63  Pulse: 63  Temp: 36.9 C  Resp: 20    Complications: No apparent anesthesia complications

## 2014-10-04 ENCOUNTER — Encounter (HOSPITAL_BASED_OUTPATIENT_CLINIC_OR_DEPARTMENT_OTHER): Payer: Self-pay | Admitting: General Surgery

## 2014-12-16 ENCOUNTER — Ambulatory Visit (INDEPENDENT_AMBULATORY_CARE_PROVIDER_SITE_OTHER): Payer: Medicaid Other | Admitting: Diagnostic Neuroimaging

## 2014-12-16 ENCOUNTER — Encounter: Payer: Self-pay | Admitting: Diagnostic Neuroimaging

## 2014-12-16 VITALS — BP 126/73 | HR 66 | Ht 64.0 in | Wt 146.8 lb

## 2014-12-16 DIAGNOSIS — G4440 Drug-induced headache, not elsewhere classified, not intractable: Secondary | ICD-10-CM | POA: Insufficient documentation

## 2014-12-16 DIAGNOSIS — G4486 Cervicogenic headache: Secondary | ICD-10-CM

## 2014-12-16 DIAGNOSIS — G43009 Migraine without aura, not intractable, without status migrainosus: Secondary | ICD-10-CM | POA: Insufficient documentation

## 2014-12-16 DIAGNOSIS — R51 Headache: Secondary | ICD-10-CM

## 2014-12-16 DIAGNOSIS — T3995XA Adverse effect of unspecified nonopioid analgesic, antipyretic and antirheumatic, initial encounter: Secondary | ICD-10-CM

## 2014-12-16 MED ORDER — ATENOLOL 100 MG PO TABS: 100.0000 mg | ORAL_TABLET | Freq: Every day | ORAL | Status: DC

## 2014-12-16 MED ORDER — TOPIRAMATE 50 MG PO TABS: 50.0000 mg | ORAL_TABLET | Freq: Two times a day (BID) | ORAL | Status: DC

## 2014-12-16 NOTE — Patient Instructions (Signed)
Increase atenolol to  at bedtime.  Start topiramate  at bedtime; after 2 weks increase to twice a day.  Continue sumatriptan as needed for migraines.  Consider psychiatry/psychology evaluation for depression/anxiety.
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
## 2015-03-18 ENCOUNTER — Ambulatory Visit (INDEPENDENT_AMBULATORY_CARE_PROVIDER_SITE_OTHER): Payer: Medicaid Other | Admitting: Diagnostic Neuroimaging

## 2015-03-18 ENCOUNTER — Encounter: Payer: Self-pay | Admitting: Diagnostic Neuroimaging

## 2015-03-18 VITALS — BP 118/67 | HR 54 | Ht 64.0 in | Wt 147.0 lb

## 2015-03-18 DIAGNOSIS — R51 Headache: Secondary | ICD-10-CM

## 2015-03-18 DIAGNOSIS — G4440 Drug-induced headache, not elsewhere classified, not intractable: Secondary | ICD-10-CM

## 2015-03-18 DIAGNOSIS — T3995XA Adverse effect of unspecified nonopioid analgesic, antipyretic and antirheumatic, initial encounter: Secondary | ICD-10-CM

## 2015-03-18 DIAGNOSIS — G4486 Cervicogenic headache: Secondary | ICD-10-CM

## 2015-03-18 DIAGNOSIS — G43009 Migraine without aura, not intractable, without status migrainosus: Secondary | ICD-10-CM

## 2015-03-18 MED ORDER — VERAPAMIL HCL 40 MG PO TABS: 40.0000 mg | ORAL_TABLET | Freq: Two times a day (BID) | ORAL | Status: DC

## 2015-03-18 MED ORDER — ATENOLOL 100 MG PO TABS: 50.0000 mg | ORAL_TABLET | Freq: Every day | ORAL | Status: DC
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
## 2015-03-18 NOTE — Patient Instructions (Signed)
Thank you for coming to see [place] at [facility]. I hope we have been able to provide you high quality care today.  You may receive a patient satisfaction survey over the next few weeks. We would appreciate your feedback and comments so that we may continue to improve ourselves and the health of our patients.  - start verapamil '40mg'$  twice a day - reduce atenolol to '50mg'$  at bedtime - monitor blood pressure and heart rate; monitor for dizziness and lightheadedness   ~~~~~~~~~~~~~~~~~~~~~~~~~~~~~~~~~~~~~~~~~~~~~~~~~~~~~~~~~~~~~~~~~  DR. PENUMALLI'S GUIDE TO HAPPY AND HEALTHY LIVING These are some of my general health and wellness recommendations. Some of them may apply to you better than others. Please use common sense as you try these suggestions and feel free to ask me any questions.   ACTIVITY/FITNESS Mental, social, emotional and physical stimulation are very important for brain and body health. Try learning a new activity (arts, music, language, sports, games).  Keep moving your body to the best of your abilities. You can do this at home, inside or outside, the park, community center, gym or anywhere you like. Consider a physical therapist or personal trainer to get started. Consider the app Sworkit. Fitness trackers such as smart-watches, smart-phones or Fitbits can help as well.   NUTRITION Eat more plants: colorful vegetables, nuts, seeds and berries.  Eat less sugar, salt, preservatives and processed foods.  Avoid toxins such as cigarettes and alcohol.  Drink water when you are thirsty. Warm water with a slice of lemon is an excellent morning drink to start the day.  Consider these websites for more information The Nutrition Source (https://www.henry-hernandez.biz/) Precision Nutrition (WindowBlog.ch)   RELAXATION Consider practicing mindfulness meditation or other relaxation techniques such as deep breathing,  prayer, yoga, tai chi, massage. See website mindful.org or the apps Headspace or Calm to help get started.   SLEEP Try to get at least 7-8+ hours sleep per day. Regular exercise and reduced caffeine will help you sleep better. Practice good sleep hygeine techniques. See website sleep.org for more information.   PLANNING Prepare estate planning, living will, healthcare POA documents. Sometimes this is best planned with the help of an attorney. Theconversationproject.org and agingwithdignity.org are excellent resources.

## 2015-06-18 ENCOUNTER — Ambulatory Visit: Payer: Medicaid Other | Admitting: Diagnostic Neuroimaging

## 2015-06-18 ENCOUNTER — Telehealth: Payer: Self-pay | Admitting: *Deleted

## 2015-06-18 NOTE — Telephone Encounter (Signed)
Spoke with patient and rescheduled her, at her request, for April, due to provider being out of office today with illness. Patient stated "this is good because I have an ovarian cyst that is causing me a lot of pain." She verbalized understanding, appreciation.

## 2015-06-30 ENCOUNTER — Ambulatory Visit (INDEPENDENT_AMBULATORY_CARE_PROVIDER_SITE_OTHER): Payer: Medicaid Other | Admitting: Diagnostic Neuroimaging

## 2015-06-30 ENCOUNTER — Encounter: Payer: Self-pay | Admitting: Diagnostic Neuroimaging

## 2015-06-30 VITALS — BP 108/64 | HR 66 | Ht 64.0 in | Wt 139.2 lb

## 2015-06-30 DIAGNOSIS — R51 Headache: Secondary | ICD-10-CM | POA: Diagnosis not present

## 2015-06-30 DIAGNOSIS — G4486 Cervicogenic headache: Secondary | ICD-10-CM

## 2015-06-30 DIAGNOSIS — G43009 Migraine without aura, not intractable, without status migrainosus: Secondary | ICD-10-CM | POA: Diagnosis not present

## 2015-06-30 MED ORDER — ATENOLOL 25 MG PO TABS: 25.0000 mg | ORAL_TABLET | Freq: Every day | ORAL | Status: DC

## 2015-06-30 MED ORDER — VERAPAMIL HCL 40 MG PO TABS: 40.0000 mg | ORAL_TABLET | Freq: Two times a day (BID) | ORAL | Status: DC

## 2015-06-30 MED ORDER — SUMATRIPTAN SUCCINATE 100 MG PO TABS: 100.0000 mg | ORAL_TABLET | ORAL | Status: DC | PRN

## 2015-06-30 NOTE — Patient Instructions (Signed)
Thank you for coming to see [place] at [facility]. I hope we have been able to provide you high quality care today.  You may receive a patient satisfaction survey over the next few weeks. We would appreciate your feedback and comments so that we may continue to improve ourselves and the health of our patients.  - increase verapamil to 40-26m twice a day (40 / 80 x 1 month; then 833mtwice a day) - reduce atenolol to 2579mt bedtime - continue sumatriptan as needed    ~~~~~~~~~~~~~~~~~~~~~~~~~~~~~~~~~~~~~~~~~~~~~~~~~~~~~~~~~~~~~~~~~  DR. PENUMALLI'S GUIDE TO HAPPY AND HEALTHY LIVING These are some of my general health and wellness recommendations. Some of them may apply to you better than others. Please use common sense as you try these suggestions and feel free to ask me any questions.   ACTIVITY/FITNESS Mental, social, emotional and physical stimulation are very important for brain and body health. Try learning a new activity (arts, music, language, sports, games).  Keep moving your body to the best of your abilities. You can do this at home, inside or outside, the park, community center, gym or anywhere you like. Consider a physical therapist or personal trainer to get started. Consider the app Sworkit. Fitness trackers such as smart-watches, smart-phones or Fitbits can help as well.   NUTRITION Eat more plants: colorful vegetables, nuts, seeds and berries.  Eat less sugar, salt, preservatives and processed foods.  Avoid toxins such as cigarettes and alcohol.  Drink water when you are thirsty. Warm water with a slice of lemon is an excellent morning drink to start the day.  Consider these websites for more information The Nutrition Source (htthttps://www.henry-hernandez.biz/recision Nutrition (wwwWindowBlog.ch RELAXATION Consider practicing mindfulness meditation or other relaxation techniques such as deep breathing,  prayer, yoga, tai chi, massage. See website mindful.org or the apps Headspace or Calm to help get started.   SLEEP Try to get at least 7-8+ hours sleep per day. Regular exercise and reduced caffeine will help you sleep better. Practice good sleep hygeine techniques. See website sleep.org for more information.   PLANNING Prepare estate planning, living will, healthcare POA documents. Sometimes this is best planned with the help of an attorney. Theconversationproject.org and agingwithdignity.org are excellent resources.
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
## 2015-10-13 ENCOUNTER — Ambulatory Visit: Payer: Medicaid Other | Admitting: Diagnostic Neuroimaging

## 2015-10-29 ENCOUNTER — Encounter: Payer: Self-pay | Admitting: Diagnostic Neuroimaging

## 2015-10-29 ENCOUNTER — Ambulatory Visit (INDEPENDENT_AMBULATORY_CARE_PROVIDER_SITE_OTHER): Payer: Medicaid Other | Admitting: Diagnostic Neuroimaging

## 2015-10-29 VITALS — BP 101/65 | HR 60 | Wt 137.0 lb

## 2015-10-29 DIAGNOSIS — R51 Headache: Secondary | ICD-10-CM

## 2015-10-29 DIAGNOSIS — G43009 Migraine without aura, not intractable, without status migrainosus: Secondary | ICD-10-CM | POA: Diagnosis not present

## 2015-10-29 DIAGNOSIS — G4486 Cervicogenic headache: Secondary | ICD-10-CM

## 2015-10-29 MED ORDER — PROMETHAZINE HCL 25 MG PO TABS: 25.0000 mg | ORAL_TABLET | Freq: Three times a day (TID) | ORAL | 6 refills | Status: DC | PRN

## 2015-10-29 MED ORDER — SUMATRIPTAN SUCCINATE 100 MG PO TABS: 100.0000 mg | ORAL_TABLET | ORAL | 12 refills | Status: DC | PRN

## 2015-10-29 MED ORDER — VERAPAMIL HCL 40 MG PO TABS: 40.0000 mg | ORAL_TABLET | Freq: Two times a day (BID) | ORAL | 12 refills | Status: DC
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
## 2015-10-29 NOTE — Patient Instructions (Addendum)
-   continue current medications  - try a new activity (learn a new language, art, physical, volunteer work)

## 2016-04-30 ENCOUNTER — Encounter: Payer: Self-pay | Admitting: Diagnostic Neuroimaging

## 2016-04-30 ENCOUNTER — Ambulatory Visit (INDEPENDENT_AMBULATORY_CARE_PROVIDER_SITE_OTHER): Payer: Medicaid Other | Admitting: Diagnostic Neuroimaging

## 2016-04-30 VITALS — BP 93/60 | HR 61 | Wt 135.2 lb

## 2016-04-30 DIAGNOSIS — R51 Headache: Secondary | ICD-10-CM

## 2016-04-30 DIAGNOSIS — F329 Major depressive disorder, single episode, unspecified: Secondary | ICD-10-CM | POA: Diagnosis not present

## 2016-04-30 DIAGNOSIS — G43009 Migraine without aura, not intractable, without status migrainosus: Secondary | ICD-10-CM

## 2016-04-30 DIAGNOSIS — F32A Depression, unspecified: Secondary | ICD-10-CM

## 2016-04-30 DIAGNOSIS — G4486 Cervicogenic headache: Secondary | ICD-10-CM

## 2016-04-30 MED ORDER — PREDNISONE 10 MG PO TABS: ORAL_TABLET | ORAL | 0 refills | Status: DC

## 2016-04-30 NOTE — Patient Instructions (Signed)
Thank you for coming to see [place] at [facility]. I hope we have been able to provide you high quality care today.  You may receive a patient satisfaction survey over the next few weeks. We would appreciate your feedback and comments so that we may continue to improve ourselves and the health of our patients.   - try prednisone pack (6 days)  - start physical activity program (bootcamp or boxing style)    ~~~~~~~~~~~~~~~~~~~~~~~~~~~~~~~~~~~~~~~~~~~~~~~~~~~~~~~~~~~~~~~~~  DR. PENUMALLI'S GUIDE TO HAPPY AND HEALTHY LIVING These are some of my general health and wellness recommendations. Some of them may apply to you better than others. Please use common sense as you try these suggestions and feel free to ask me any questions.   ACTIVITY/FITNESS Mental, social, emotional and physical stimulation are very important for brain and body health. Try learning a new activity (arts, music, language, sports, games).  Keep moving your body to the best of your abilities. You can do this at home, inside or outside, the park, community center, gym or anywhere you like. Consider a physical therapist or personal trainer to get started. Consider the app Sworkit. Fitness trackers such as smart-watches, smart-phones or Fitbits can help as well.   NUTRITION Eat more plants: colorful vegetables, nuts, seeds and berries.  Eat less sugar, salt, preservatives and processed foods.  Avoid toxins such as cigarettes and alcohol.  Drink water when you are thirsty. Warm water with a slice of lemon is an excellent morning drink to start the day.  Consider these websites for more information The Nutrition [employer] (https://www.henry-hernandez.biz/) [employer] Nutrition (WindowBlog.ch)   RELAXATION Consider practicing mindfulness meditation or other relaxation techniques such as deep breathing, prayer, yoga, tai chi, massage. See website mindful.org or the  apps Headspace or Calm to help get started.   SLEEP Try to get at least 7-8+ hours sleep per day. Regular exercise and reduced caffeine will help you sleep better. Practice good sleep hygeine techniques. See website sleep.org for more information.   PLANNING Prepare estate planning, living will, healthcare POA documents. Sometimes this is best planned with the help of an attorney. Theconversationproject.org and agingwithdignity.org are excellent resources.
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
## 2016-07-14 ENCOUNTER — Other Ambulatory Visit: Payer: Self-pay | Admitting: *Deleted

## 2016-07-14 MED ORDER — ATENOLOL 25 MG PO TABS: 25.0000 mg | ORAL_TABLET | Freq: Every day | ORAL | 12 refills | Status: DC

## 2016-09-03 ENCOUNTER — Ambulatory Visit (INDEPENDENT_AMBULATORY_CARE_PROVIDER_SITE_OTHER): Payer: Medicaid Other | Admitting: Diagnostic Neuroimaging

## 2016-09-03 ENCOUNTER — Encounter: Payer: Self-pay | Admitting: Diagnostic Neuroimaging

## 2016-09-03 VITALS — BP 102/61 | HR 64 | Wt 129.8 lb

## 2016-09-03 DIAGNOSIS — R51 Headache: Secondary | ICD-10-CM

## 2016-09-03 DIAGNOSIS — G4486 Cervicogenic headache: Secondary | ICD-10-CM

## 2016-09-03 DIAGNOSIS — F32A Depression, unspecified: Secondary | ICD-10-CM

## 2016-09-03 DIAGNOSIS — F329 Major depressive disorder, single episode, unspecified: Secondary | ICD-10-CM | POA: Diagnosis not present

## 2016-09-03 DIAGNOSIS — G43009 Migraine without aura, not intractable, without status migrainosus: Secondary | ICD-10-CM | POA: Diagnosis not present

## 2016-09-03 MED ORDER — VERAPAMIL HCL 80 MG PO TABS: 80.0000 mg | ORAL_TABLET | Freq: Two times a day (BID) | ORAL | 4 refills | Status: DC

## 2016-09-03 MED ORDER — ATENOLOL 25 MG PO TABS: 25.0000 mg | ORAL_TABLET | Freq: Every day | ORAL | 4 refills | Status: DC

## 2016-09-03 MED ORDER — SUMATRIPTAN SUCCINATE 100 MG PO TABS: 100.0000 mg | ORAL_TABLET | ORAL | 12 refills | Status: DC | PRN
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
## 2017-03-08 ENCOUNTER — Ambulatory Visit: Payer: Medicaid Other | Admitting: Diagnostic Neuroimaging

## 2017-03-09 ENCOUNTER — Telehealth: Payer: Self-pay | Admitting: *Deleted

## 2017-03-09 NOTE — Telephone Encounter (Signed)
Called patient to reschedule due to office closed for snow. LVM.

## 2017-09-16 ENCOUNTER — Other Ambulatory Visit: Payer: Self-pay | Admitting: Diagnostic Neuroimaging

## 2017-09-26 ENCOUNTER — Ambulatory Visit: Payer: Medicaid Other | Admitting: Diagnostic Neuroimaging

## 2017-09-26 ENCOUNTER — Encounter

## 2017-09-26 ENCOUNTER — Encounter: Payer: Self-pay | Admitting: Diagnostic Neuroimaging

## 2017-09-26 VITALS — BP 107/65 | HR 57 | Ht 64.0 in | Wt 133.6 lb

## 2017-09-26 DIAGNOSIS — R51 Headache: Secondary | ICD-10-CM

## 2017-09-26 DIAGNOSIS — G43009 Migraine without aura, not intractable, without status migrainosus: Secondary | ICD-10-CM | POA: Diagnosis not present

## 2017-09-26 DIAGNOSIS — G4486 Cervicogenic headache: Secondary | ICD-10-CM

## 2017-09-26 MED ORDER — SUMATRIPTAN SUCCINATE 100 MG PO TABS: 100.0000 mg | ORAL_TABLET | ORAL | 12 refills | Status: AC | PRN

## 2017-09-26 MED ORDER — PROMETHAZINE HCL 25 MG PO TABS: 25.0000 mg | ORAL_TABLET | Freq: Three times a day (TID) | ORAL | 6 refills | Status: AC | PRN

## 2017-09-26 MED ORDER — ATENOLOL 25 MG PO TABS: 25.0000 mg | ORAL_TABLET | Freq: Every day | ORAL | 4 refills | Status: DC

## 2017-09-26 MED ORDER — VERAPAMIL HCL 80 MG PO TABS: 80.0000 mg | ORAL_TABLET | Freq: Two times a day (BID) | ORAL | 4 refills | Status: DC
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
## 2018-07-10 ENCOUNTER — Telehealth: Payer: Self-pay | Admitting: *Deleted

## 2018-07-10 NOTE — Telephone Encounter (Signed)
Received fax from [facility]: [place] for Atenolol. Atenolol is preferred drug per [facility] formulary. Called [facility] and was told the patient must have changed insurance, no longer has [facility]. Pharmacist did not have current insurance information.  Called patient, LVM requesting call back to discuss.

## 2018-07-17 NOTE — Telephone Encounter (Signed)
Called patient and LVM requesting call back if she is having issue getting Atenolol filled.

## 2018-09-28 ENCOUNTER — Ambulatory Visit: Payer: Medicaid Other | Admitting: Adult Health

## 2018-09-28 ENCOUNTER — Ambulatory Visit: Payer: Medicaid Other | Admitting: Family Medicine

## 2018-10-04 ENCOUNTER — Telehealth: Payer: Self-pay | Admitting: *Deleted

## 2018-10-04 ENCOUNTER — Ambulatory Visit: Payer: Self-pay | Admitting: Family Medicine

## 2018-10-04 NOTE — Telephone Encounter (Signed)
Opened in Error.

## 2018-10-05 ENCOUNTER — Other Ambulatory Visit: Payer: Self-pay

## 2018-10-05 ENCOUNTER — Encounter: Payer: Self-pay | Admitting: Family Medicine

## 2018-10-05 ENCOUNTER — Ambulatory Visit: Payer: Self-pay | Attending: Family Medicine | Admitting: Family Medicine

## 2018-10-05 ENCOUNTER — Ambulatory Visit (HOSPITAL_COMMUNITY)
Admission: RE | Admit: 2018-10-05 | Discharge: 2018-10-05 | Disposition: A | Discharge status: Home / Self Care | Payer: Medicaid Other | Source: Ambulatory Visit | Attending: Family Medicine | Admitting: Family Medicine

## 2018-10-05 VITALS — BP 135/81 | HR 89 | Temp 98.7°F | Ht 64.0 in | Wt 136.4 lb

## 2018-10-05 DIAGNOSIS — R079 Chest pain, unspecified: Secondary | ICD-10-CM

## 2018-10-05 DIAGNOSIS — R002 Palpitations: Secondary | ICD-10-CM

## 2018-10-05 NOTE — Progress Notes (Signed)
New Patient   Per pt 2 wks ago she thinks she had a mild heart attack. Per pt she knows she suffers from Anxiety but she knows this was not one of her anxiety attacks. Per pt she had similar episode 2 months ago and now wants something to be done.   Per pt she also wants to discuss her menopause with provider today. Per pt she did blood work back in Feb with per previous doctor office that indicated that she started menopause.
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
## 2018-10-06 LAB — BASIC METABOLIC PANEL WITH GFR
BUN/Creatinine Ratio: 23 (ref 9–23)
BUN: 15 mg/dL (ref 6–24)
CO2: 26 mmol/L (ref 20–29)
Calcium: 10.3 mg/dL — ABNORMAL HIGH (ref 8.7–10.2)
Chloride: 102 mmol/L (ref 96–106)
Creatinine, Ser: 0.64 mg/dL (ref 0.57–1.00)
GFR calc Af Amer: 120 mL/min/1.73
GFR calc non Af Amer: 104 mL/min/1.73
Glucose: 85 mg/dL (ref 65–99)
Potassium: 4.4 mmol/L (ref 3.5–5.2)
Sodium: 144 mmol/L (ref 134–144)

## 2018-10-10 ENCOUNTER — Other Ambulatory Visit: Payer: Self-pay | Admitting: Diagnostic Neuroimaging

## 2018-10-12 ENCOUNTER — Other Ambulatory Visit: Payer: Self-pay | Admitting: Diagnostic Neuroimaging

## 2018-10-16 ENCOUNTER — Other Ambulatory Visit: Payer: Self-pay | Admitting: Diagnostic Neuroimaging

## 2018-10-18 ENCOUNTER — Other Ambulatory Visit: Payer: Self-pay

## 2018-10-18 ENCOUNTER — Ambulatory Visit: Payer: Self-pay | Admitting: Family Medicine

## 2018-10-18 ENCOUNTER — Encounter: Payer: Self-pay | Admitting: Family Medicine

## 2018-10-18 ENCOUNTER — Telehealth: Payer: Self-pay | Admitting: *Deleted

## 2018-10-18 VITALS — BP 111/65 | HR 67 | Temp 95.5°F | Ht 64.0 in | Wt 135.8 lb

## 2018-10-18 DIAGNOSIS — G43009 Migraine without aura, not intractable, without status migrainosus: Secondary | ICD-10-CM

## 2018-10-18 MED ORDER — VERAPAMIL HCL 80 MG PO TABS: 80.0000 mg | ORAL_TABLET | Freq: Two times a day (BID) | ORAL | 3 refills | Status: AC

## 2018-10-18 MED ORDER — ATENOLOL 25 MG PO TABS: 25.0000 mg | ORAL_TABLET | Freq: Every day | ORAL | 3 refills | Status: AC
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
## 2018-10-18 NOTE — Patient Instructions (Signed)
Continue verapamil 80mg  twice daily  Continue atenolol 25mg  at bedtime  Consider Amovig, Ajovy or Emgality pending insurance coverage   Follow up in 1 year, sooner if needed   Migraine Headache A migraine headache is a very strong throbbing pain on one side or both sides of your head. This type of headache can also cause other symptoms. It can last from 4 hours to 3 days. Talk with your doctor about what things may bring on (trigger) this condition. What are the causes? The exact cause of this condition is not known. This condition may be triggered or caused by:  Drinking alcohol.  Smoking.  Taking medicines, such as: ? Medicine used to treat chest pain (nitroglycerin). ? Birth control pills. ? Estrogen. ? Some blood pressure medicines.  Eating or drinking certain products.  Doing physical activity. Other things that may trigger a migraine headache include:  Having a menstrual period.  Pregnancy.  Hunger.  Stress.  Not getting enough sleep or getting too much sleep.  Weather changes.  Tiredness (fatigue). What increases the risk?  Being 7825-51 years old.  Being female.  Having a family history of migraine headaches.  Being Caucasian.  Having depression or anxiety.  Being very overweight. What are the signs or symptoms?  A throbbing pain. This pain may: ? Happen in any area of the head, such as on one side or both sides. ? Make it hard to do daily activities. ? Get worse with physical activity. ? Get worse around bright lights or loud noises.  Other symptoms may include: ? Feeling sick to your stomach (nauseous). ? Vomiting. ? Dizziness. ? Being sensitive to bright lights, loud noises, or smells.  Before you get a migraine headache, you may get warning signs (an aura). An aura may include: ? Seeing flashing lights or having blind spots. ? Seeing bright spots, halos, or zigzag lines. ? Having tunnel vision or blurred vision. ? Having numbness or a  tingling feeling. ? Having trouble talking. ? Having weak muscles.  Some people have symptoms after a migraine headache (postdromal phase), such as: ? Tiredness. ? Trouble thinking (concentrating). How is this treated?  Taking medicines that: ? Relieve pain. ? Relieve the feeling of being sick to your stomach. ? Prevent migraine headaches.  Treatment may also include: ? Having acupuncture. ? Avoiding foods that bring on migraine headaches. ? Learning ways to control your body functions (biofeedback). ? Therapy to help you know and deal with negative thoughts (cognitive behavioral therapy). Follow these instructions at home: Medicines  Take over-the-counter and prescription medicines only as told by your doctor.  Ask your doctor if the medicine prescribed to you: ? Requires you to avoid driving or using heavy machinery. ? Can cause trouble pooping (constipation). You may need to take these steps to prevent or treat trouble pooping:  Drink enough fluid to keep your pee (urine) pale yellow.  Take over-the-counter or prescription medicines.  Eat foods that are high in fiber. These include beans, whole grains, and fresh fruits and vegetables.  Limit foods that are high in fat and sugar. These include fried or sweet foods. Lifestyle  Do not drink alcohol.  Do not use any products that contain nicotine or tobacco, such as cigarettes, e-cigarettes, and chewing tobacco. If you need help quitting, ask your doctor.  Get at least 8 hours of sleep every night.  Limit and deal with stress. General instructions      Keep a journal to find out what may  bring on your migraine headaches. For example, write down: ? What you eat and drink. ? How much sleep you get. ? Any change in what you eat or drink. ? Any change in your medicines.  If you have a migraine headache: ? Avoid things that make your symptoms worse, such as bright lights. ? It may help to lie down in a dark, quiet  room. ? Do not drive or use heavy machinery. ? Ask your doctor what activities are safe for you.  Keep all follow-up visits as told by your doctor. This is important. Contact a doctor if:  You get a migraine headache that is different or worse than others you have had.  You have more than 15 headache days in one month. Get help right away if:  Your migraine headache gets very bad.  Your migraine headache lasts longer than 72 hours.  You have a fever.  You have a stiff neck.  You have trouble seeing.  Your muscles feel weak or like you cannot control them.  You start to lose your balance a lot.  You start to have trouble walking.  You pass out (faint).  You have a seizure. Summary  A migraine headache is a very strong throbbing pain on one side or both sides of your head. These headaches can also cause other symptoms.  This condition may be treated with medicines and changes to your lifestyle.  Keep a journal to find out what may bring on your migraine headaches.  Contact a doctor if you get a migraine headache that is different or worse than others you have had.  Contact your doctor if you have more than 15 headache days in a month. This information is not intended to replace advice given to you by your health care provider. Make sure you discuss any questions you have with your health care provider. Document Released: 12/23/2007 Document Revised: 07/07/2018 Document Reviewed: 04/27/2018 Elsevier Patient Education  2020 [employer].  [place] injection What is this medicine? ERENUMAB (e REN ue mab) is used to prevent migraine headaches. This medicine may be used for other purposes; ask your health care provider or pharmacist if you have questions. COMMON BRAND NAME(S): Aimovig What should I tell my health care provider before I take this medicine? They need to know if you have any of these conditions:  an unusual or allergic reaction to erenumab, latex, other  medicines, foods, dyes, or preservatives  high blood pressure  pregnant or trying to get pregnant  breast-feeding How should I use this medicine? This medicine is for injection under the skin. You will be taught how to prepare and give this medicine. Use exactly as directed. Take your medicine at regular intervals. Do not take your medicine more often than directed. It is important that you put your used needles and syringes in a special sharps container. Do not put them in a trash can. If you do not have a sharps container, call your pharmacist or healthcare provider to get one. Talk to your pediatrician regarding the use of this medicine in children. Special care may be needed. Overdosage: If you think you have taken too much of this medicine contact a poison control center or emergency room at once. NOTE: This medicine is only for you. Do not share this medicine with others. What if I miss a dose? If you miss a dose, take it as soon as you can. If it is almost time for your next dose, take only that  dose. Do not take double or extra doses. What may interact with this medicine? Interactions are not expected. This list may not describe all possible interactions. Give your health care provider a list of all the medicines, herbs, non-prescription drugs, or dietary supplements you use. Also tell them if you smoke, drink alcohol, or use illegal drugs. Some items may interact with your medicine. What should I watch for while using this medicine? Tell your doctor or healthcare professional if your symptoms do not start to get better or if they get worse. What side effects may I notice from receiving this medicine? Side effects that you should report to your doctor or health care professional as soon as possible:  allergic reactions like skin rash, itching or hives, swelling of the face, lips, or tongue  chest pain  fast, irregular heartbeat  feeling faint or lightheaded  palpitations Side  effects that usually do not require medical attention (report these to your doctor or health care professional if they continue or are bothersome):  constipation  muscle cramps  pain, redness, or irritation at site where injected This list may not describe all possible side effects. Call your doctor for medical advice about side effects. You may report side effects to FDA at 1-800-FDA-1088. Where should I keep my medicine? Keep out of the reach of children. You will be instructed on how to store this medicine. Throw away any unused medicine after the expiration date on the label. NOTE: This sheet is a summary. It may not cover all possible information. If you have questions about this medicine, talk to your doctor, pharmacist, or health care provider.  2020 Elsevier/Gold Standard (2018-07-31 15:43:58)  [place] injection What is this medicine? FREMANEZUMAB (fre ma NEZ ue mab) is used to prevent migraine headaches. This medicine may be used for other purposes; ask your health care provider or pharmacist if you have questions. COMMON BRAND NAME(S): AJOVY What should I tell my health care provider before I take this medicine? They need to know if you have any of these conditions:  an unusual or allergic reaction to fremanezumab, other medicines, foods, dyes, or preservatives  pregnant or trying to get pregnant  breast-feeding How should I use this medicine? This medicine is for injection under the skin. You will be taught how to prepare and give this medicine. Use exactly as directed. Take your medicine at regular intervals. Do not take your medicine more often than directed. It is important that you put your used needles and syringes in a special sharps container. Do not put them in a trash can. If you do not have a sharps container, call your pharmacist or healthcare provider to get one. Talk to your pediatrician regarding the use of this medicine in children. Special care may be  needed. Overdosage: If you think you have taken too much of this medicine contact a poison control center or emergency room at once. NOTE: This medicine is only for you. Do not share this medicine with others. What if I miss a dose? If you miss a dose, take it as soon as you can. If it is almost time for your next dose, take only that dose. Do not take double or extra doses. What may interact with this medicine? Interactions are not expected. This list may not describe all possible interactions. Give your health care provider a list of all the medicines, herbs, non-prescription drugs, or dietary supplements you use. Also tell them if you smoke, drink alcohol, or use illegal  drugs. Some items may interact with your medicine. What should I watch for while using this medicine? Tell your doctor or healthcare professional if your symptoms do not start to get better or if they get worse. What side effects may I notice from receiving this medicine? Side effects that you should report to your doctor or health care professional as soon as possible:  allergic reactions like skin rash, itching or hives, swelling of the face, lips, or tongue Side effects that usually do not require medical attention (report these to your doctor or health care professional if they continue or are bothersome):  pain, redness, or irritation at site where injected This list may not describe all possible side effects. Call your doctor for medical advice about side effects. You may report side effects to FDA at 1-800-FDA-1088. Where should I keep my medicine? Keep out of the reach of children. You will be instructed on how to store this medicine. Throw away any unused medicine after the expiration date on the label. NOTE: This sheet is a summary. It may not cover all possible information. If you have questions about this medicine, talk to your doctor, pharmacist, or health care provider.  2020 Elsevier/Gold Standard (2016-12-13  17:22:56)   Galcanezumab injection What is this medicine? GALCANEZUMAB (gal ka NEZ ue mab) is used to prevent migraines and treat cluster headaches. This medicine may be used for other purposes; ask your health care provider or pharmacist if you have questions. COMMON BRAND NAME(S): Emgality What should I tell my health care provider before I take this medicine? They need to know if you have any of these conditions:  an unusual or allergic reaction to galcanezumab, other medicines, foods, dyes, or preservatives  pregnant or trying to get pregnant  breast-feeding How should I use this medicine? This medicine is for injection under the skin. You will be taught how to prepare and give this medicine. Use exactly as directed. Take your medicine at regular intervals. Do not take your medicine more often than directed. It is important that you put your used needles and syringes in a special sharps container. Do not put them in a trash can. If you do not have a sharps container, call your pharmacist or healthcare provider to get one. Talk to your pediatrician regarding the use of this medicine in children. Special care may be needed. Overdosage: If you think you have taken too much of this medicine contact a poison control center or emergency room at once. NOTE: This medicine is only for you. Do not share this medicine with others. What if I miss a dose? If you miss a dose, take it as soon as you can. If it is almost time for your next dose, take only that dose. Do not take double or extra doses. What may interact with this medicine? Interactions are not expected. This list may not describe all possible interactions. Give your health care provider a list of all the medicines, herbs, non-prescription drugs, or dietary supplements you use. Also tell them if you smoke, drink alcohol, or use illegal drugs. Some items may interact with your medicine. What should I watch for while using this medicine?  Tell your doctor or healthcare professional if your symptoms do not start to get better or if they get worse. What side effects may I notice from receiving this medicine? Side effects that you should report to your doctor or health care professional as soon as possible:  allergic reactions like skin rash, itching  or hives, swelling of the face, lips, or tongue Side effects that usually do not require medical attention (report these to your doctor or health care professional if they continue or are bothersome):  pain, redness, or irritation at site where injected This list may not describe all possible side effects. Call your doctor for medical advice about side effects. You may report side effects to FDA at 1-800-FDA-1088. Where should I keep my medicine? Keep out of the reach of children. You will be instructed on how to store this medicine. Throw away any unused medicine after the expiration date on the label. NOTE: This sheet is a summary. It may not cover all possible information. If you have questions about this medicine, talk to your doctor, pharmacist, or health care provider.  2020 Elsevier/Gold Standard (2017-08-31 12:03:23)

## 2018-10-18 NOTE — Telephone Encounter (Signed)
Spoke with patient and informed her that I received another PA request for Atenolol and Verapamil with Capulin Tracks. I asked if she had new insurance, and she stated she has no insurance at this time. I reviewed VM I left for her in April. She stated when she asked for refills all the pharmacy told her was that they faxed it over to [place]. She has been off both medicines x 1 week. She called our office yesterday, was told she needed apt. She will see Amy, NP this afternoon to discuss. POer [email or web address], both medications are < $5 a month. She verbalized understanding, appreciation.

## 2018-10-23 NOTE — Telephone Encounter (Signed)
I received PA requests for verapamil and atenolol.  I called pharmacy and spoke to [facility].  Pt did pick up prescriptions.  (using [facility]).  Will disregard PA requests.

## 2018-10-24 NOTE — Progress Notes (Signed)
I reviewed note and agree with plan.   Penni Bombard, MD 7/35/6701, 4:10 AM Certified in Neurology, Neurophysiology and Neuroimaging  [facility] [address] [place],  [address] [phone]

## 2018-10-25 ENCOUNTER — Other Ambulatory Visit: Payer: Self-pay

## 2018-10-25 ENCOUNTER — Ambulatory Visit: Payer: Self-pay | Attending: Family Medicine

## 2018-12-06 ENCOUNTER — Other Ambulatory Visit: Payer: Self-pay | Admitting: Pharmacist

## 2018-12-06 MED ORDER — LEVOTHYROXINE SODIUM 75 MCG PO TABS: 75.0000 ug | ORAL_TABLET | Freq: Every day | ORAL | 2 refills | Status: AC

## 2018-12-06 MED FILL — LEVOTHYROXINE 75 MCG TABLET: 75 | 30 days supply | Qty: 30 | Fill #0
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
## 2018-12-15 ENCOUNTER — Encounter: Payer: Self-pay | Admitting: Cardiology

## 2018-12-15 ENCOUNTER — Ambulatory Visit (INDEPENDENT_AMBULATORY_CARE_PROVIDER_SITE_OTHER): Payer: No Typology Code available for payment source | Admitting: Cardiology

## 2018-12-15 ENCOUNTER — Other Ambulatory Visit: Payer: Self-pay

## 2018-12-15 VITALS — BP 110/67 | HR 75 | Temp 97.4°F | Ht 63.5 in | Wt 131.0 lb

## 2018-12-15 DIAGNOSIS — Z01818 Encounter for other preprocedural examination: Secondary | ICD-10-CM

## 2018-12-15 DIAGNOSIS — R072 Precordial pain: Secondary | ICD-10-CM

## 2018-12-15 MED ORDER — METOPROLOL TARTRATE 50 MG PO TABS: ORAL_TABLET | ORAL | 0 refills | Status: AC

## 2018-12-15 NOTE — Patient Instructions (Signed)
Medication Instructions:  Your physician recommends that you continue on your current medications as directed. Please refer to the Current Medication list given to you today.  If you need a refill on your cardiac medications before your next appointment, please call your pharmacy.   Lab work: None ordered If you have labs (blood work) drawn today and your tests are completely normal, you will receive your results only by: [place] (if you have MyChart) OR A paper copy in the mail If you have any lab test that is abnormal or we need to change your treatment, we will call you to review the results.  Testing/Procedures: Your physician has requested that you have cardiac CT. Cardiac computed tomography (CT) is a painless test that uses an x-ray machine to take clear, detailed pictures of your heart. For further information please visit [email or web address]. Please follow instruction sheet as given.   Follow-Up: As needed with Dr. Percival Spanish.   Any Other Special Instructions Will Be Listed Below (If Applicable). Your cardiac CT will be scheduled at one of the below locations:   [facility] [address] [place], [place] [address] (336) [place] [address] [place], [place] [address] [phone]  If scheduled at [facility], please arrive at the [facility] main entrance of [facility] 30-45 minutes prior to test start time. Proceed to the [facility] Radiology Department (first floor) to check-in and test prep.  If scheduled at [facility], please arrive 15 mins early for check-in and test prep.  Please follow these instructions carefully (unless otherwise directed):  On the Night Before the Test: . Be sure to Drink plenty of water. . Do not consume any caffeinated/decaffeinated beverages or chocolate 12 hours prior to your test. . Do not take any  antihistamines 12 hours prior to your test.  On the Day of the Test: . Drink plenty of water. Do not drink any water within one hour of the test. . Do not eat any food 4 hours prior to the test. . You may take your regular medications prior to the test.  . Take metoprolol (Lopressor) the night before the test and then two hours prior to test. . FEMALES- please wear underwire-free bra if available       After the Test: . Drink plenty of water. . After receiving IV contrast, you may experience a mild flushed feeling. This is normal. . On occasion, you may experience a mild rash up to 24 hours after the test. This is not dangerous. If this occurs, you can take Benadryl 25 mg and increase your fluid intake. . If you experience trouble breathing, this can be serious. If it is severe call 911 IMMEDIATELY. If it is mild, please call our office. . If you take any of these medications: Glipizide/Metformin, Avandament, Glucavance, please do not take 48 hours after completing test unless otherwise instructed.    Please contact the cardiac imaging nurse navigator should you have any questions/concerns Marchia Bond, RN Navigator Cardiac Imaging [place] [facility] [phone] Office  [phone] Cell

## 2019-01-31 ENCOUNTER — Other Ambulatory Visit: Payer: Self-pay

## 2019-01-31 DIAGNOSIS — Z01818 Encounter for other preprocedural examination: Secondary | ICD-10-CM

## 2019-01-31 DIAGNOSIS — R072 Precordial pain: Secondary | ICD-10-CM

## 2019-02-01 LAB — BASIC METABOLIC PANEL
BUN/Creatinine Ratio: 19 (ref 9–23)
BUN: 12 mg/dL (ref 6–24)
CO2: 23 mmol/L (ref 20–29)
Calcium: 9.9 mg/dL (ref 8.7–10.2)
Chloride: 101 mmol/L (ref 96–106)
Creatinine, Ser: 0.63 mg/dL (ref 0.57–1.00)
GFR calc Af Amer: 120 mL/min/{1.73_m2} (ref >59)
GFR calc non Af Amer: 104 mL/min/{1.73_m2} (ref >59)
Glucose: 80 mg/dL (ref 65–99)
Potassium: 3.9 mmol/L (ref 3.5–5.2)
Sodium: 140 mmol/L (ref 134–144)

## 2019-02-05 ENCOUNTER — Telehealth (HOSPITAL_COMMUNITY): Payer: Self-pay | Admitting: Emergency Medicine

## 2019-02-05 NOTE — Telephone Encounter (Signed)
Left message on voicemail with name and callback number Latasia Silberstein RN Navigator Cardiac Imaging [facility] [phone] Office [phone] Cell  

## 2019-02-06 ENCOUNTER — Other Ambulatory Visit: Payer: Self-pay

## 2019-02-06 ENCOUNTER — Ambulatory Visit (HOSPITAL_COMMUNITY)
Admission: RE | Admit: 2019-02-06 | Discharge: 2019-02-06 | Disposition: A | Discharge status: Home / Self Care | Payer: Self-pay | Source: Ambulatory Visit | Attending: Cardiology | Admitting: Cardiology

## 2019-02-06 DIAGNOSIS — R072 Precordial pain: Secondary | ICD-10-CM | POA: Insufficient documentation

## 2019-02-06 MED ORDER — IOHEXOL 350 MG/ML SOLN
80.0000 mL | Freq: Once | INTRAVENOUS | Status: AC | PRN
  Administered 2019-02-06: 16:00:00 80 mL via INTRAVENOUS

## 2019-02-06 MED ORDER — NITROGLYCERIN 0.4 MG SL SUBL
0.8000 mg | SUBLINGUAL_TABLET | Freq: Once | SUBLINGUAL | Status: AC
  Administered 2019-02-06: 15:00:00 0.8 mg via SUBLINGUAL

## 2019-02-06 MED ORDER — NITROGLYCERIN 0.4 MG SL SUBL
SUBLINGUAL_TABLET | SUBLINGUAL | Status: AC
  Filled 2019-02-06: qty 2

## 2019-02-09 MED FILL — LEVOTHYROXINE 75 MCG TABLET: 75 | 30 days supply | Qty: 30 | Fill #1

## 2019-04-09 MED FILL — LEVOTHYROXINE SODIUM 75 MCG: 75 | 30 days supply | Qty: 30 | Fill #2
